# Patient Record
Sex: Female | Born: 1960
Health system: Southern US, Community
[De-identification: ages and names within clinical notes are randomized; demographics above are authoritative.]

## PROBLEM LIST (undated history)

## (undated) DIAGNOSIS — F419 Anxiety disorder, unspecified: Secondary | ICD-10-CM

## (undated) DIAGNOSIS — K219 Gastro-esophageal reflux disease without esophagitis: Secondary | ICD-10-CM

## (undated) DIAGNOSIS — I10 Essential (primary) hypertension: Secondary | ICD-10-CM

## (undated) DIAGNOSIS — N840 Polyp of corpus uteri: Secondary | ICD-10-CM

## (undated) DIAGNOSIS — D649 Anemia, unspecified: Secondary | ICD-10-CM

## (undated) DIAGNOSIS — T7840XA Allergy, unspecified, initial encounter: Secondary | ICD-10-CM

## (undated) DIAGNOSIS — R32 Unspecified urinary incontinence: Secondary | ICD-10-CM

## (undated) DIAGNOSIS — M1990 Unspecified osteoarthritis, unspecified site: Secondary | ICD-10-CM

## (undated) DIAGNOSIS — Z8719 Personal history of other diseases of the digestive system: Secondary | ICD-10-CM

## (undated) DIAGNOSIS — G4700 Insomnia, unspecified: Secondary | ICD-10-CM

## (undated) DIAGNOSIS — G4733 Obstructive sleep apnea (adult) (pediatric): Secondary | ICD-10-CM

## (undated) DIAGNOSIS — G4730 Sleep apnea, unspecified: Secondary | ICD-10-CM

## (undated) HISTORY — DX: Obstructive sleep apnea (adult) (pediatric): G47.33

## (undated) HISTORY — DX: Anemia, unspecified: D64.9

## (undated) HISTORY — DX: Unspecified urinary incontinence: R32

## (undated) HISTORY — DX: Gastro-esophageal reflux disease without esophagitis: K21.9

## (undated) HISTORY — DX: Sleep apnea, unspecified: G47.30

## (undated) HISTORY — DX: Polyp of corpus uteri: N84.0

## (undated) HISTORY — PX: OTHER SURGICAL HISTORY: SHX169

## (undated) HISTORY — DX: Personal history of other diseases of the digestive system: Z87.19

## (undated) HISTORY — PX: TONSILLECTOMY: SUR1361

## (undated) HISTORY — DX: Anxiety disorder, unspecified: F41.9

## (undated) HISTORY — DX: Allergy, unspecified, initial encounter: T78.40XA

## (undated) HISTORY — PX: CHOLECYSTECTOMY: SHX55

## (undated) HISTORY — DX: Essential (primary) hypertension: I10

## (undated) HISTORY — DX: Insomnia, unspecified: G47.00

## (undated) HISTORY — DX: Unspecified osteoarthritis, unspecified site: M19.90

## (undated) HISTORY — PX: URETHRAL SLING: SHX2621

---

## 1998-01-03 ENCOUNTER — Other Ambulatory Visit: Admission: RE | Admit: 1998-01-03 | Discharge: 1998-01-03 | Payer: Self-pay | Admitting: *Deleted

## 1998-02-22 ENCOUNTER — Ambulatory Visit (HOSPITAL_COMMUNITY): Admission: RE | Admit: 1998-02-22 | Discharge: 1998-02-22 | Payer: Self-pay | Admitting: *Deleted

## 1998-10-20 ENCOUNTER — Other Ambulatory Visit: Admission: RE | Admit: 1998-10-20 | Discharge: 1998-10-20 | Payer: Self-pay | Admitting: Obstetrics and Gynecology

## 1999-09-11 ENCOUNTER — Other Ambulatory Visit: Admission: RE | Admit: 1999-09-11 | Discharge: 1999-09-11 | Payer: Self-pay | Admitting: *Deleted

## 2001-02-09 ENCOUNTER — Other Ambulatory Visit: Admission: RE | Admit: 2001-02-09 | Discharge: 2001-02-09 | Payer: Self-pay | Admitting: Obstetrics and Gynecology

## 2002-02-10 ENCOUNTER — Other Ambulatory Visit: Admission: RE | Admit: 2002-02-10 | Discharge: 2002-02-10 | Payer: Self-pay | Admitting: Obstetrics and Gynecology

## 2002-05-04 ENCOUNTER — Encounter: Admission: RE | Admit: 2002-05-04 | Discharge: 2002-05-04 | Payer: Self-pay | Admitting: Otolaryngology

## 2002-05-04 ENCOUNTER — Encounter: Payer: Self-pay | Admitting: Otolaryngology

## 2002-09-03 ENCOUNTER — Encounter: Payer: Self-pay | Admitting: Internal Medicine

## 2002-09-03 ENCOUNTER — Encounter: Admission: RE | Admit: 2002-09-03 | Discharge: 2002-09-03 | Payer: Self-pay | Admitting: Internal Medicine

## 2003-02-17 ENCOUNTER — Other Ambulatory Visit: Admission: RE | Admit: 2003-02-17 | Discharge: 2003-02-17 | Payer: Self-pay | Admitting: Obstetrics and Gynecology

## 2004-07-03 ENCOUNTER — Other Ambulatory Visit: Admission: RE | Admit: 2004-07-03 | Discharge: 2004-07-03 | Payer: Self-pay | Admitting: Obstetrics and Gynecology

## 2006-04-21 ENCOUNTER — Ambulatory Visit (HOSPITAL_COMMUNITY): Admission: RE | Admit: 2006-04-21 | Discharge: 2006-04-21 | Payer: Self-pay | Admitting: Family Medicine

## 2006-06-04 ENCOUNTER — Ambulatory Visit (HOSPITAL_COMMUNITY): Admission: RE | Admit: 2006-06-04 | Discharge: 2006-06-04 | Payer: Self-pay | Admitting: General Surgery

## 2006-06-04 ENCOUNTER — Encounter (INDEPENDENT_AMBULATORY_CARE_PROVIDER_SITE_OTHER): Payer: Self-pay | Admitting: *Deleted

## 2006-12-11 ENCOUNTER — Ambulatory Visit (HOSPITAL_COMMUNITY): Admission: RE | Admit: 2006-12-11 | Discharge: 2006-12-11 | Payer: Self-pay | Admitting: Obstetrics and Gynecology

## 2009-05-17 ENCOUNTER — Encounter: Admission: RE | Admit: 2009-05-17 | Discharge: 2009-05-17 | Payer: Self-pay | Admitting: Obstetrics and Gynecology

## 2010-03-18 ENCOUNTER — Encounter: Payer: Self-pay | Admitting: Obstetrics and Gynecology

## 2010-07-10 NOTE — H&P (Signed)
Dawn [name], ANDREATTA                ACCOUNT NO.:  [id number]   MEDICAL RECORD NO.:  [id number]          PATIENT TYPE:  AMB   LOCATION:  SDC                           FACILITY:  [facility]   PHYSICIAN:  Juluis Mire, M.D.   DATE OF BIRTH:  [birth year]   DATE OF ADMISSION:  12/11/2006  DATE OF DISCHARGE:                              HISTORY & PHYSICAL   HISTORY OF PRESENT ILLNESS:  The patient is a 50 year old gravida 2,  para 1 abortus one female presents for mid urethral sling.   In relation to present admission, the patient's having trouble with  worsening stress incontinence.  She leaks with coughing and sneezing and  various physical activity is become increasing limited for her.  She did  undergo urodynamic testing here in the office.  Testing revealed no  evidence of uninhibited bladder contractions.  She had normal volumes  studies.  Did leak with coughing and Valsalva.  She did have normal leak  point pressures.  Urethral pressure profile was normal.  She had a  normal voiding cystourethrogram with no evidence of obstructive voiding  pattern.  This was consistent with urethral hypermobility, now presents  for a mid urethral sling.   ALLERGIES:  NO KNOWN DRUG ALLERGIES.   MEDICATIONS:  Effexor, Nexium, lisinopril and hydrochlorothiazide.   PAST MEDICAL HISTORY:  Being actively managed for hypertension as noted.  Otherwise usual childhood disease without any significant sequelae.   PAST SURGICAL HISTORY:  She has had tonsillectomy.  She has had her  gallbladder removed earlier this year.   OBSTETRICAL HISTORY:  She has had a vaginal delivery and a miscarriage.   SOCIAL HISTORY:  Reveals occasional alcohol.  No tobacco use.   FAMILY HISTORY:  Father with history of diabetes.  There is also a  history of heart disease and hypertension in the family.   REVIEW OF SYSTEMS:  Noncontributory.   PHYSICAL EXAMINATION:  VITAL SIGNS:  Afebrile, stable vital signs.  HEENT:  The patient  is normocephalic.  Pupils equal and react to light  accommodation.  Extraocular movements were intact.  Sclerae and  conjunctivae are clear.  Oropharynx clear.  NECK:  Without thyromegaly.  BREASTS:  No discrete masses.  LUNGS:  Clear.  CARDIOVASCULAR:  Regular rhythm and rate.  No murmurs or gallops.  ABDOMEN:  Exam is benign.  No mass, organomegaly or tenderness.  PELVIC:  Normal external genitalia.  Vaginal mucosa is clear.  Mild  cystourethrocele.  Cervix unremarkable.  Uterus normal size, shape and  contour.  Adnexa free of masses or tenderness.  EXTREMITIES:  Trace edema.  NEUROLOGICAL:  Grossly within normal limits.   IMPRESSION:  1. Anatomical stress urinary incontinence due to urethral      hypermobility.  2. Hypertension control.   PLAN:  The patient will undergo mid urethral sling using obturator  approach.  The risks have been explained including the risk of  infection.  The risk of hemorrhage could require transfusion with the  risk of AIDS or hepatitis.  Risk of injury to adjacent organs including  bladder, urethra or ureters that  could require further exploratory  surgery.  Risk of obturator nerve injury that could lead to with chronic  leg pain and weakness.  The risk of over tightening that could lead to  inability to void requiring taking down the sling that could have  recurrence of incontinence.  The risk of mesh erosion.  The risk of  increasing bladder spasms that could require long term medication.  The  risk of deep venous thrombosis and pulmonary embolus.  The patient  expressed understanding of indications and risks.      Juluis Mire, M.D.  Electronically Signed     JSM/MEDQ  D:  12/11/2006  T:  12/12/2006  Job:  [id number]

## 2010-07-10 NOTE — Op Note (Signed)
NAMESALOME, [name]                ACCOUNT NO.:  [id number]   MEDICAL RECORD NO.:  [id number]          PATIENT TYPE:  AMB   LOCATION:  SDC                           FACILITY:  [facility]   PHYSICIAN:  Juluis Mire, M.D.   DATE OF BIRTH:  03-17-1960   DATE OF PROCEDURE:  DATE OF DISCHARGE:                               OPERATIVE REPORT   PREOPERATIVE DIAGNOSIS:  Anatomical stress urinary incontinence due to  urethral hypermobility.   POSTOPERATIVE DIAGNOSIS:  Anatomical stress urinary incontinence due to  urethral hypermobility.   PROCEDURE:  1. Mid urethral sling using a transobturator approach.  2. Cystoscopy.   SURGEON:  Juluis Mire, M.D.   ANESTHESIA:  General.   ESTIMATED BLOOD LOSS:  Minimal.   PACKS AND DRAINS:  None.   INTRAOPERATIVE BLOOD REPLACED:  None.   COMPLICATIONS:  None.   INDICATIONS:  Noted in the history and physical.   DESCRIPTION OF PROCEDURE:  The patient was taken to the OR and placed in  the supine position.  After a satisfactory level general endotracheal  anesthesia was obtained, the patient was placed in dorsal position using  the Allen stirrups.  Perineum and vagina prepped out with Betadine and  draped in a sterile field.  A weighted speculum was placed in the  vaginal vault.  A prominent cystourethrocele was noted.  A Foley was put  in place and inflated.  The bladder was empty.  The mid urethral area  was identified, the vaginal mucosa was injected with Xylocaine and  1:100,000 of epinephrine.  A midline incision was made over the  suburethral area, and the vaginal mucosa was dissected free out  laterally.  We were able to bluntly dissect out to the obturator foramen  on each side.   Next, an area on the skin was identified at the level of the clitoris,  but below the abductor longus muscle tendon, and to the lateral aspect  of the inferior pubic ramus.  This area was injected with Xylocaine, and  incision was made.  Using the needles  they were passed first through the  skin around the obturator foramen, and out through the vaginal opening  on each side.  Cystoscopy was performed.  There was no bladder or  urethral injuries.  Ureteral orifices were identified, and urine was  spilling from both copiously.  Cystoscope was removed and Foley packs  were placed back to straight drain.  The polypropylene mesh was brought  in place and hooked to the needles, and brought out through the skin.  It was appropriately tightened in the mid urethral until it lay flat,  but would easily insert a Kelly, and rotate at 90-degrees.  So we had a  nice suburethral location, lying flat, but not overly tightened.  The  plastic sleeves removed.  The mesh was trimmed at the level of skin.  The skin was closed with Dermabond.  Vaginal incision closed with a  running suture of 2-0 Vicryl.  We had good hemostasis.  Foley was placed  back to straight drain.   The patient was taken  out of the dorsal lithotomy position.  Once alert  and extubated, transferred to the recovery room in good condition.  Sponge, instrument, and needle count reported correct by circulating  nurse x2.      Juluis Mire, M.D.  Electronically Signed     JSM/MEDQ  D:  12/11/2006  T:  12/12/2006  Job:  [id number]

## 2010-07-13 NOTE — Op Note (Signed)
Dawn [name], Dawn [name]                ACCOUNT NO.:  [id number]   MEDICAL RECORD NO.:  [id number]          PATIENT TYPE:  AMB   LOCATION:  DAY                          FACILITY:  [facility]   PHYSICIAN:  Anselm Pancoast. Weatherly, M.D.DATE OF BIRTH:  1960-03-03   DATE OF PROCEDURE:  06/04/2006  DATE OF DISCHARGE:                               OPERATIVE REPORT   PREOPERATIVE DIAGNOSIS:  Chronic cholecystitis with stones.   POSTOPERATIVE DIAGNOSIS:  Chronic cholecystitis with stones.   OPERATION:  Laparoscopic cholecystectomy with cholangiogram.   SURGEON:  Anselm Pancoast. Zachery Dakins, M.D.   ASSISTANT:  Francina Ames, M.D.   ANESTHESIA:  General anesthesia.   HISTORY:  Dawn [name] is a 50 year old female who was referred to me  courtesy of Dr. Merri Brunette for symptomatic gallstones.  The patient  has history of epigastric discomfort.  She has had 1 child who is now  approximately 59.  She has been on Nexium and had laboratory studies  that showed her H. pylori was positive.  She was treated for that but  still continued to have symptoms.  She then had an ultrasound that  showed about a 2 cm stone within her gallbladder.  She did not have an  acutely inflamed gallbladder.  She was referred to me and I saw her in  the office approximately 3 to 4 weeks ago.  At that time, she was  certainly not acutely ill.  Her symptoms certainly sounded like biliary  colic.  She is here today for cholecystectomy and cholangiogram.  She is  on hydrochlorothiazide, a small dose, and takes Celebrex occasionally  but not recently.  Preoperatively, she was given 3 grams of Unasyn.  She  has PAS stockings.  The patient was identified and taken to the  operative suite.  Induction of general anesthesia with endotracheal tube  or oral tube into the stomach.  Then, the abdomen was prepped with  Betadine solution and draped in sterile manner.  A small incision was  made below the umbilicus.  The fascia was identified and  picked up  between 2 Kochers and then carefully entered through this, identifying  the posterior layer which was carefully picked up and then a small  opening made.  The underlying peritoneum was opened with a Tresa Endo.  Pursestring suture of 0 Vicryl was placed.  Hasson cannula was  introduced.  The gallbladder was not acutely inflamed but there were  adhesions around it.  The upper 10-mm trocar was placed in the  subxiphoid area and the 2 lateral 5-mm trocars were placed by Dr. Maple Hudson,  the assistant.  The gallbladder was retracted upward and outward.  The  adhesions from the omentum and also adhesions to the duodenum area were  carefully taken down so that the proximal portion of the gallbladder  could be identified.  The cystic duct lymph node was identified and then  a small structure that we first thought was a small cystic duct was  clipped distally and then a small opening made __________  artery, and  it was double clipped proximally and then divided.  The  underlying  cystic duct was under this and it was picked up and elevated, clipped  flush with the gallbladder, and a small opening made proximally and a  Cook catheter introduced.  X-ray  was obtained which showed a fairly  long cystic duct with good flow into the duodenum and no evidence of any  abnormality of the extrahepatic biliary system.  The catheter was  removed.  The cystic duct was clipped proximally and then divided.  The  little structures posteriorly were carefully taken down in case there  was a posterior branch of the cystic artery.  It was clipped before  dividing.  The gallbladder was then freed from its bed with the [facility]  electrocautery.  No bile was spilled.  The gallbladder was placed in the  EndoCatch bag.  Good hemostasis of the gallbladder bed.  We then  switched the camera to the upper 10-mm port and withdrew the bag  containing the gallstone.  The stone would come up through the fascia  without actually  enlarging it, and then I closed the fascia with figure-  of-eight of 0 Vicryl in addition to the pursestrings and both of these  were tied.  We then put Marcaine in the fascia at the umbilicus and  irrigated, removing the little irrigating fluid and a small amount of  blood that had been lost with the artery before it had been clipped.  Everything was dry.  Then, the 5-mm ports were withdrawn under direct  vision.  The carbon dioxide was released.  The upper 10-mm trocar was  withdrawn with the head elevated so that if there would be any remaining  carbon dioxide it could be released.  We did put a figure-of-eight of 0  Vicryl in the fascia at the subxiphoid area.  Then, the subcutaneous  wounds were closed with 4-0 Vicryl.  Benzoin and Steri-Strips on the  skin.  The patient tolerated the procedure nicely.  We will let the  patient decide whether she wants to be discharged this afternoon.  She  should do fine.  I will see her back in the office in approximately 2  weeks.  She will resume all of her chronic medications and have Vicodin  for pain.           ______________________________  Anselm Pancoast. Zachery Dakins, M.D.     WJW/MEDQ  D:  06/04/2006  T:  06/04/2006  Job:  [id number]   cc:   Dario Guardian, M.D.  Fax: [phone]

## 2010-11-02 LAB — HM COLONOSCOPY

## 2010-12-05 LAB — CBC
HCT: 38.6
Hemoglobin: 13.1
MCHC: 34
MCV: 86.8
RBC: 4.45

## 2010-12-05 LAB — BASIC METABOLIC PANEL
BUN: 14
CO2: 28
Calcium: 9.5
Creatinine, Ser: 0.91
Glucose, Bld: 102 — ABNORMAL HIGH

## 2010-12-05 LAB — HCG, SERUM, QUALITATIVE: Preg, Serum: NEGATIVE

## 2011-06-17 ENCOUNTER — Other Ambulatory Visit: Payer: Self-pay | Admitting: Obstetrics and Gynecology

## 2012-05-20 ENCOUNTER — Other Ambulatory Visit: Payer: Self-pay | Admitting: Dermatology

## 2012-07-01 LAB — HM DEXA SCAN: HM Dexa Scan: NORMAL

## 2014-09-07 ENCOUNTER — Other Ambulatory Visit: Payer: Self-pay | Admitting: Obstetrics and Gynecology

## 2014-09-08 LAB — CYTOLOGY - PAP

## 2015-07-05 DIAGNOSIS — D239 Other benign neoplasm of skin, unspecified: Secondary | ICD-10-CM | POA: Diagnosis not present

## 2015-07-05 DIAGNOSIS — D2261 Melanocytic nevi of right upper limb, including shoulder: Secondary | ICD-10-CM | POA: Diagnosis not present

## 2015-07-05 DIAGNOSIS — D2262 Melanocytic nevi of left upper limb, including shoulder: Secondary | ICD-10-CM | POA: Diagnosis not present

## 2015-07-05 DIAGNOSIS — N760 Acute vaginitis: Secondary | ICD-10-CM | POA: Diagnosis not present

## 2015-07-05 DIAGNOSIS — D225 Melanocytic nevi of trunk: Secondary | ICD-10-CM | POA: Diagnosis not present

## 2015-09-11 DIAGNOSIS — Z01419 Encounter for gynecological examination (general) (routine) without abnormal findings: Secondary | ICD-10-CM | POA: Diagnosis not present

## 2015-09-11 DIAGNOSIS — Z6837 Body mass index (BMI) 37.0-37.9, adult: Secondary | ICD-10-CM | POA: Diagnosis not present

## 2015-09-11 DIAGNOSIS — N951 Menopausal and female climacteric states: Secondary | ICD-10-CM | POA: Diagnosis not present

## 2015-09-11 DIAGNOSIS — Z1231 Encounter for screening mammogram for malignant neoplasm of breast: Secondary | ICD-10-CM | POA: Diagnosis not present

## 2015-09-11 LAB — HM PAP SMEAR: HM PAP: NEGATIVE

## 2015-09-13 LAB — HM MAMMOGRAPHY

## 2015-10-25 DIAGNOSIS — N83202 Unspecified ovarian cyst, left side: Secondary | ICD-10-CM | POA: Diagnosis not present

## 2015-10-25 DIAGNOSIS — N950 Postmenopausal bleeding: Secondary | ICD-10-CM | POA: Diagnosis not present

## 2015-11-27 DIAGNOSIS — N840 Polyp of corpus uteri: Secondary | ICD-10-CM | POA: Diagnosis not present

## 2015-11-30 DIAGNOSIS — K644 Residual hemorrhoidal skin tags: Secondary | ICD-10-CM | POA: Diagnosis not present

## 2015-11-30 DIAGNOSIS — N840 Polyp of corpus uteri: Secondary | ICD-10-CM | POA: Diagnosis not present

## 2015-11-30 DIAGNOSIS — L918 Other hypertrophic disorders of the skin: Secondary | ICD-10-CM | POA: Diagnosis not present

## 2015-12-19 DIAGNOSIS — K219 Gastro-esophageal reflux disease without esophagitis: Secondary | ICD-10-CM | POA: Diagnosis not present

## 2015-12-19 DIAGNOSIS — I10 Essential (primary) hypertension: Secondary | ICD-10-CM | POA: Diagnosis not present

## 2015-12-19 DIAGNOSIS — G4700 Insomnia, unspecified: Secondary | ICD-10-CM | POA: Diagnosis not present

## 2015-12-19 DIAGNOSIS — Z23 Encounter for immunization: Secondary | ICD-10-CM | POA: Diagnosis not present

## 2015-12-19 DIAGNOSIS — F329 Major depressive disorder, single episode, unspecified: Secondary | ICD-10-CM | POA: Diagnosis not present

## 2016-03-19 DIAGNOSIS — L821 Other seborrheic keratosis: Secondary | ICD-10-CM | POA: Diagnosis not present

## 2016-03-19 DIAGNOSIS — L570 Actinic keratosis: Secondary | ICD-10-CM | POA: Diagnosis not present

## 2016-06-18 DIAGNOSIS — G4700 Insomnia, unspecified: Secondary | ICD-10-CM | POA: Diagnosis not present

## 2016-06-18 DIAGNOSIS — I10 Essential (primary) hypertension: Secondary | ICD-10-CM | POA: Diagnosis not present

## 2016-06-18 DIAGNOSIS — K219 Gastro-esophageal reflux disease without esophagitis: Secondary | ICD-10-CM | POA: Diagnosis not present

## 2016-06-18 DIAGNOSIS — F329 Major depressive disorder, single episode, unspecified: Secondary | ICD-10-CM | POA: Diagnosis not present

## 2016-07-09 ENCOUNTER — Ambulatory Visit (INDEPENDENT_AMBULATORY_CARE_PROVIDER_SITE_OTHER): Payer: BLUE CROSS/BLUE SHIELD | Admitting: Family Medicine

## 2016-07-09 ENCOUNTER — Encounter: Payer: Self-pay | Admitting: Family Medicine

## 2016-07-09 VITALS — BP 122/80 | HR 88 | Temp 98.6°F | Ht 67.0 in | Wt 238.5 lb

## 2016-07-09 DIAGNOSIS — K219 Gastro-esophageal reflux disease without esophagitis: Secondary | ICD-10-CM

## 2016-07-09 DIAGNOSIS — F419 Anxiety disorder, unspecified: Secondary | ICD-10-CM | POA: Diagnosis not present

## 2016-07-09 DIAGNOSIS — Z7189 Other specified counseling: Secondary | ICD-10-CM

## 2016-07-09 DIAGNOSIS — G4700 Insomnia, unspecified: Secondary | ICD-10-CM

## 2016-07-09 DIAGNOSIS — I10 Essential (primary) hypertension: Secondary | ICD-10-CM | POA: Diagnosis not present

## 2016-07-09 NOTE — Progress Notes (Signed)
Hypertension:    Using medication without problems or lightheadedness: yes Chest pain with exertion:no Edema:no Short of breath:no Diet and exercise d/w pt, encouraged.    Anxiety.  Longstanding.  Noted stressors caring for her elderly mother. Compliant with current medications. Rare use of benzodiazepine. No adverse effect on Effexor. No SI/HI.   Insomnia.  Long-standing. Likely exacerbated by anxiety. Had used Ambien before without adverse effect. Rare use of benzodiazepine as described above. Ambien did help her sleep.  Husband designated if patient were incapacitated.    Meds, vitals, and allergies reviewed.   PMH and SH reviewed  ROS: Per HPI unless specifically indicated in ROS section   GEN: nad, alert and oriented HEENT: mucous membranes moist NECK: supple w/o LA CV: rrr. PULM: ctab, no inc wob ABD: soft, +bs EXT: no edema SKIN: no acute rash

## 2016-07-09 NOTE — Patient Instructions (Addendum)
Check your meds and make sure you have a total of 20mg  of lisinopril and 25mg  of HCTZ in a day.  We may be able to combine that later on.   Please send me copy of your recent labs.   I'll work on getting your old records in the meantime.  Take care.  Glad to see you.

## 2016-07-10 ENCOUNTER — Encounter: Payer: Self-pay | Admitting: Family Medicine

## 2016-07-10 DIAGNOSIS — Z7189 Other specified counseling: Secondary | ICD-10-CM | POA: Insufficient documentation

## 2016-07-10 DIAGNOSIS — K219 Gastro-esophageal reflux disease without esophagitis: Secondary | ICD-10-CM | POA: Insufficient documentation

## 2016-07-10 DIAGNOSIS — I10 Essential (primary) hypertension: Secondary | ICD-10-CM | POA: Insufficient documentation

## 2016-07-10 DIAGNOSIS — G4700 Insomnia, unspecified: Secondary | ICD-10-CM | POA: Insufficient documentation

## 2016-07-10 DIAGNOSIS — R32 Unspecified urinary incontinence: Secondary | ICD-10-CM | POA: Insufficient documentation

## 2016-07-10 DIAGNOSIS — F419 Anxiety disorder, unspecified: Secondary | ICD-10-CM | POA: Insufficient documentation

## 2016-07-10 NOTE — Assessment & Plan Note (Signed)
Husband designated if patient were incapacitated. 

## 2016-07-10 NOTE — Assessment & Plan Note (Signed)
Continue present medication. No adverse effect on medication. Requesting records. She agrees.

## 2016-07-10 NOTE — Assessment & Plan Note (Signed)
Continue present medication. No adverse effect on medication. Requesting records. She agrees. 

## 2016-07-10 NOTE — Assessment & Plan Note (Addendum)
Continue present medication. No adverse effect on medication. Requesting records. She agrees. >30 minutes spent in face to face time with patient, >50% spent in counselling or coordination of care, discussing diet and exercise, rationale for current meds, etc.

## 2016-07-17 ENCOUNTER — Encounter: Payer: Self-pay | Admitting: Family Medicine

## 2016-07-30 ENCOUNTER — Encounter: Payer: Self-pay | Admitting: Family Medicine

## 2016-07-30 LAB — GLUCOSE, RANDOM
ALT: 14
AST: 16 U/L
BUN: 14 mg/dL (ref 4–21)
CREATININE: 0.98
GLUCOSE: 97

## 2016-08-20 ENCOUNTER — Encounter: Payer: Self-pay | Admitting: Family Medicine

## 2016-09-03 DIAGNOSIS — D225 Melanocytic nevi of trunk: Secondary | ICD-10-CM | POA: Diagnosis not present

## 2016-09-03 DIAGNOSIS — D2261 Melanocytic nevi of right upper limb, including shoulder: Secondary | ICD-10-CM | POA: Diagnosis not present

## 2016-09-03 DIAGNOSIS — Z808 Family history of malignant neoplasm of other organs or systems: Secondary | ICD-10-CM | POA: Diagnosis not present

## 2016-09-03 DIAGNOSIS — D2271 Melanocytic nevi of right lower limb, including hip: Secondary | ICD-10-CM | POA: Diagnosis not present

## 2016-09-03 DIAGNOSIS — L570 Actinic keratosis: Secondary | ICD-10-CM | POA: Diagnosis not present

## 2016-09-11 ENCOUNTER — Encounter: Payer: Self-pay | Admitting: Family Medicine

## 2016-09-11 DIAGNOSIS — Z6837 Body mass index (BMI) 37.0-37.9, adult: Secondary | ICD-10-CM | POA: Diagnosis not present

## 2016-09-11 DIAGNOSIS — Z1231 Encounter for screening mammogram for malignant neoplasm of breast: Secondary | ICD-10-CM | POA: Diagnosis not present

## 2016-09-11 DIAGNOSIS — Z01419 Encounter for gynecological examination (general) (routine) without abnormal findings: Secondary | ICD-10-CM | POA: Diagnosis not present

## 2016-09-12 ENCOUNTER — Other Ambulatory Visit: Payer: Self-pay | Admitting: *Deleted

## 2016-09-12 MED ORDER — ESOMEPRAZOLE MAGNESIUM 40 MG PO CPDR: 40.0000 mg | DELAYED_RELEASE_CAPSULE | Freq: Every day | ORAL | 0 refills | Status: DC

## 2016-09-25 ENCOUNTER — Encounter: Payer: Self-pay | Admitting: Family Medicine

## 2016-09-26 ENCOUNTER — Other Ambulatory Visit: Payer: Self-pay | Admitting: Family Medicine

## 2016-09-26 MED ORDER — LISINOPRIL-HYDROCHLOROTHIAZIDE 20-25 MG PO TABS: 1.0000 | ORAL_TABLET | Freq: Every day | ORAL | 3 refills | Status: DC

## 2016-09-26 MED ORDER — VENLAFAXINE HCL ER 150 MG PO CP24: 150.0000 mg | ORAL_CAPSULE | Freq: Every day | ORAL | 3 refills | Status: DC

## 2016-10-16 DIAGNOSIS — N950 Postmenopausal bleeding: Secondary | ICD-10-CM | POA: Diagnosis not present

## 2016-10-24 DIAGNOSIS — N950 Postmenopausal bleeding: Secondary | ICD-10-CM | POA: Diagnosis not present

## 2016-10-24 DIAGNOSIS — D251 Intramural leiomyoma of uterus: Secondary | ICD-10-CM | POA: Diagnosis not present

## 2016-12-04 DIAGNOSIS — Z23 Encounter for immunization: Secondary | ICD-10-CM | POA: Diagnosis not present

## 2016-12-04 DIAGNOSIS — L570 Actinic keratosis: Secondary | ICD-10-CM | POA: Diagnosis not present

## 2016-12-24 DIAGNOSIS — N950 Postmenopausal bleeding: Secondary | ICD-10-CM | POA: Diagnosis not present

## 2016-12-31 ENCOUNTER — Telehealth: Payer: Self-pay | Admitting: Gastroenterology

## 2016-12-31 NOTE — Telephone Encounter (Signed)
Dr. Russella DarStark reviewed records and okay to schedule colon.  LM on Vmail to call back to schedule

## 2016-12-31 NOTE — Telephone Encounter (Signed)
Patient states that she is due for another colonoscopy. Received Colon report and placed on Dr. Ardell IsaacsStark's desk for review. Dr. Russella DarStark is Doc of the Day for 09/13/16 AM. Records placed on his desk for review.

## 2017-02-12 NOTE — Telephone Encounter (Signed)
Patient has not returned call to schedule. Records will be in "records reviewed" folder. °

## 2017-03-07 ENCOUNTER — Encounter: Payer: Self-pay | Admitting: Family Medicine

## 2017-03-18 ENCOUNTER — Encounter: Payer: Self-pay | Admitting: Family Medicine

## 2017-03-18 LAB — LAB REPORT - SCANNED
ALT: 22
AST: 20
CHOLESTEROL, TOTAL: 205
Creatinine, Ser: 1.04
GLUCOSE: 106
HCT: 33
HDL: 67
Hemoglobin: 11.1
LDL Cholesterol (Calc): 123
MCV: 71
Potassium: 3.8
TRIGLYCERIDES: 74
TSH: 3.43

## 2017-03-24 ENCOUNTER — Other Ambulatory Visit: Payer: Self-pay | Admitting: Family Medicine

## 2017-03-24 ENCOUNTER — Ambulatory Visit: Payer: BLUE CROSS/BLUE SHIELD | Admitting: Family Medicine

## 2017-03-24 ENCOUNTER — Encounter: Payer: Self-pay | Admitting: Family Medicine

## 2017-03-24 VITALS — BP 134/80 | HR 71 | Temp 99.1°F | Wt 242.0 lb

## 2017-03-24 DIAGNOSIS — D649 Anemia, unspecified: Secondary | ICD-10-CM | POA: Diagnosis not present

## 2017-03-24 DIAGNOSIS — R739 Hyperglycemia, unspecified: Secondary | ICD-10-CM

## 2017-03-24 LAB — IBC PANEL
IRON: 32 ug/dL — AB (ref 42–145)
SATURATION RATIOS: 6.1 % — AB (ref 20.0–50.0)
Transferrin: 377 mg/dL — ABNORMAL HIGH (ref 212.0–360.0)

## 2017-03-24 LAB — CBC WITH DIFFERENTIAL/PLATELET
Basophils Absolute: 0 10*3/uL (ref 0.0–0.1)
Basophils Relative: 0.7 % (ref 0.0–3.0)
EOS ABS: 0.1 10*3/uL (ref 0.0–0.7)
Eosinophils Relative: 2.7 % (ref 0.0–5.0)
HCT: 33.6 % — ABNORMAL LOW (ref 36.0–46.0)
HEMOGLOBIN: 10.7 g/dL — AB (ref 12.0–15.0)
LYMPHS ABS: 1.5 10*3/uL (ref 0.7–4.0)
Lymphocytes Relative: 31.3 % (ref 12.0–46.0)
MCHC: 32 g/dL (ref 30.0–36.0)
MCV: 73.2 fl — ABNORMAL LOW (ref 78.0–100.0)
MONO ABS: 0.4 10*3/uL (ref 0.1–1.0)
Monocytes Relative: 8 % (ref 3.0–12.0)
NEUTROS PCT: 57.3 % (ref 43.0–77.0)
Neutro Abs: 2.7 10*3/uL (ref 1.4–7.7)
Platelets: 275 10*3/uL (ref 150.0–400.0)
RBC: 4.59 Mil/uL (ref 3.87–5.11)
RDW: 17.9 % — AB (ref 11.5–15.5)
WBC: 4.6 10*3/uL (ref 4.0–10.5)

## 2017-03-24 LAB — FERRITIN: Ferritin: 4.7 ng/mL — ABNORMAL LOW (ref 10.0–291.0)

## 2017-03-24 NOTE — Patient Instructions (Signed)
Go to the lab on the way out.  We'll contact you with your lab report. Take care.  Glad to see you.  Update me as needed.   

## 2017-03-24 NOTE — Progress Notes (Signed)
F/u re: labs.  Her hemoglobin was borderline low and MCV was low.  D/w pt.  No blood in stool. No black stools.  Normal colonoscopy prev in 2012.  FH polyps noted but not on patient's last colonoscopy.    H/o iron def in the past, per patient report.  No VB usually except for a period in august per gyn with neg w/u including neg biopsy per patient report.  She had another period in October.  No periods or VB since.    She is fasting.  She isn't on extra iron.  She isn't a vegetarian.    She is going to work on diet and exercise re: sugar.  D/w pt about prev sugar and labs.    Meds, vitals, and allergies reviewed.   ROS: Per HPI unless specifically indicated in ROS section   GEN: nad, alert and oriented HEENT: mucous membranes moist NECK: supple w/o LA CV: rrr PULM: ctab, no inc wob ABD: soft, +bs EXT: no edema

## 2017-03-25 ENCOUNTER — Other Ambulatory Visit: Payer: Self-pay | Admitting: *Deleted

## 2017-03-25 MED ORDER — ESOMEPRAZOLE MAGNESIUM 40 MG PO CPDR: 40.0000 mg | DELAYED_RELEASE_CAPSULE | Freq: Every day | ORAL | 3 refills | Status: DC

## 2017-03-25 NOTE — Telephone Encounter (Signed)
MyChart request.  See MyChart Message Ambien 10 mg  Last office visit:   03/24/17 Last Filled:   ? Please advise.

## 2017-03-26 DIAGNOSIS — D649 Anemia, unspecified: Secondary | ICD-10-CM | POA: Insufficient documentation

## 2017-03-26 DIAGNOSIS — R739 Hyperglycemia, unspecified: Secondary | ICD-10-CM | POA: Insufficient documentation

## 2017-03-26 MED ORDER — ZOLPIDEM TARTRATE 10 MG PO TABS: ORAL_TABLET | ORAL | 2 refills | Status: DC

## 2017-03-26 MED ORDER — IRON 325 (65 FE) MG PO TABS: 325.0000 mg | ORAL_TABLET | Freq: Every day | ORAL | Status: DC

## 2017-03-26 NOTE — Assessment & Plan Note (Signed)
See notes on labs.  The concern was for iron deficiency and had previous colonoscopy.  Family history of polyps noted.  Discussed with patient about options.  Reasonable to recheck labs today, likely refer back to GI.  Okay for outpatient follow-up. She agrees.

## 2017-03-26 NOTE — Telephone Encounter (Signed)
Sent rx.  mychart message sent to patient.  Thanks.

## 2017-03-26 NOTE — Assessment & Plan Note (Signed)
Discussed with patient about diet and exercise and previous labs.

## 2017-03-27 ENCOUNTER — Telehealth: Payer: Self-pay | Admitting: *Deleted

## 2017-03-27 ENCOUNTER — Encounter: Payer: Self-pay | Admitting: Gastroenterology

## 2017-03-27 NOTE — Telephone Encounter (Addendum)
She needs it due to the iron deficiency anemia.  The goal is to identify the source of the anemia/possible GI loss.  I sent her a message via mychart.  Thanks.

## 2017-03-27 NOTE — Telephone Encounter (Signed)
Upon phoning the patient about lab results, patient states she was called today to set up a colonoscopy which is scheduled for April.  Patient says she did not have any problems noted on her last colonoscopy about 5 years ago and was not really sure that she needs it.  Can you please send her a MyChart message as to whether this is needed?

## 2017-04-17 ENCOUNTER — Other Ambulatory Visit (INDEPENDENT_AMBULATORY_CARE_PROVIDER_SITE_OTHER): Payer: BLUE CROSS/BLUE SHIELD

## 2017-04-17 DIAGNOSIS — D649 Anemia, unspecified: Secondary | ICD-10-CM

## 2017-04-17 LAB — COMPREHENSIVE METABOLIC PANEL
ALBUMIN: 4.4 g/dL (ref 3.5–5.2)
ALK PHOS: 83 U/L (ref 39–117)
ALT: 13 U/L (ref 0–35)
AST: 15 U/L (ref 0–37)
BUN: 19 mg/dL (ref 6–23)
CO2: 32 mEq/L (ref 19–32)
CREATININE: 0.94 mg/dL (ref 0.40–1.20)
Calcium: 9.9 mg/dL (ref 8.4–10.5)
Chloride: 103 mEq/L (ref 96–112)
GFR: 65.31 mL/min (ref >60.00)
Glucose, Bld: 99 mg/dL (ref 70–99)
Potassium: 3.6 mEq/L (ref 3.5–5.1)
SODIUM: 142 meq/L (ref 135–145)
TOTAL PROTEIN: 7.1 g/dL (ref 6.0–8.3)
Total Bilirubin: 0.3 mg/dL (ref 0.2–1.2)

## 2017-04-17 LAB — IBC PANEL
Iron: 36 ug/dL — ABNORMAL LOW (ref 42–145)
Saturation Ratios: 7.3 % — ABNORMAL LOW (ref 20.0–50.0)
Transferrin: 354 mg/dL (ref 212.0–360.0)

## 2017-04-21 ENCOUNTER — Encounter: Payer: Self-pay | Admitting: Family Medicine

## 2017-04-22 ENCOUNTER — Other Ambulatory Visit: Payer: Self-pay | Admitting: Family Medicine

## 2017-04-22 DIAGNOSIS — D649 Anemia, unspecified: Secondary | ICD-10-CM

## 2017-04-22 MED ORDER — IRON 325 (65 FE) MG PO TABS: 325.0000 mg | ORAL_TABLET | Freq: Two times a day (BID) | ORAL | Status: DC

## 2017-05-20 ENCOUNTER — Other Ambulatory Visit: Payer: Self-pay

## 2017-05-20 ENCOUNTER — Ambulatory Visit (AMBULATORY_SURGERY_CENTER): Payer: Self-pay | Admitting: *Deleted

## 2017-05-20 ENCOUNTER — Other Ambulatory Visit (INDEPENDENT_AMBULATORY_CARE_PROVIDER_SITE_OTHER): Payer: BLUE CROSS/BLUE SHIELD

## 2017-05-20 VITALS — Ht 67.5 in | Wt 238.0 lb

## 2017-05-20 DIAGNOSIS — Z8371 Family history of colonic polyps: Secondary | ICD-10-CM

## 2017-05-20 DIAGNOSIS — D509 Iron deficiency anemia, unspecified: Secondary | ICD-10-CM

## 2017-05-20 DIAGNOSIS — D649 Anemia, unspecified: Secondary | ICD-10-CM | POA: Diagnosis not present

## 2017-05-20 LAB — CBC WITH DIFFERENTIAL/PLATELET
Basophils Absolute: 0.1 10*3/uL (ref 0.0–0.1)
Basophils Relative: 0.9 % (ref 0.0–3.0)
Eosinophils Absolute: 0.1 10*3/uL (ref 0.0–0.7)
Eosinophils Relative: 1.8 % (ref 0.0–5.0)
HCT: 41.5 % (ref 36.0–46.0)
HEMOGLOBIN: 13.7 g/dL (ref 12.0–15.0)
LYMPHS ABS: 2.3 10*3/uL (ref 0.7–4.0)
Lymphocytes Relative: 34.2 % (ref 12.0–46.0)
MCHC: 32.9 g/dL (ref 30.0–36.0)
MCV: 82 fl (ref 78.0–100.0)
Monocytes Absolute: 0.5 10*3/uL (ref 0.1–1.0)
Monocytes Relative: 7.6 % (ref 3.0–12.0)
NEUTROS PCT: 55.5 % (ref 43.0–77.0)
Neutro Abs: 3.7 10*3/uL (ref 1.4–7.7)
Platelets: 252 10*3/uL (ref 150.0–400.0)
RBC: 5.05 Mil/uL (ref 3.87–5.11)
RDW: 24.1 % — ABNORMAL HIGH (ref 11.5–15.5)
WBC: 6.7 10*3/uL (ref 4.0–10.5)

## 2017-05-20 LAB — IBC PANEL
Iron: 40 ug/dL — ABNORMAL LOW (ref 42–145)
SATURATION RATIOS: 8.8 % — AB (ref 20.0–50.0)
Transferrin: 323 mg/dL (ref 212.0–360.0)

## 2017-05-20 MED ORDER — NA SULFATE-K SULFATE-MG SULF 17.5-3.13-1.6 GM/177ML PO SOLN: 1.0000 | Freq: Once | ORAL | 0 refills | Status: AC

## 2017-05-20 NOTE — Progress Notes (Signed)
No egg or soy allergy known to patient  No issues with past sedation with any surgeries  or procedures, no intubation problems  No diet pills per patient No home 02 use per patient  No blood thinners per patient  Pt denies issues with constipation  No A fib or A flutter  EMMI video sent to pt's e mail  Pt given a pay no more than $50 coupon for suprep in [place]

## 2017-05-25 ENCOUNTER — Encounter: Payer: Self-pay | Admitting: Family Medicine

## 2017-05-27 ENCOUNTER — Ambulatory Visit: Payer: BLUE CROSS/BLUE SHIELD | Admitting: Podiatry

## 2017-05-27 ENCOUNTER — Encounter: Payer: Self-pay | Admitting: Gastroenterology

## 2017-05-27 ENCOUNTER — Encounter: Payer: Self-pay | Admitting: Podiatry

## 2017-05-27 VITALS — BP 130/74 | HR 92 | Resp 18

## 2017-05-27 DIAGNOSIS — L603 Nail dystrophy: Secondary | ICD-10-CM

## 2017-05-27 DIAGNOSIS — L853 Xerosis cutis: Secondary | ICD-10-CM

## 2017-05-27 NOTE — Progress Notes (Signed)
Subjective:    Patient ID: Dawn [name], female    DOB: Sep 02, 1960, 57 y.o.   MRN: [id number]  HPI 57 year old female presents the office today because she wants to have her toenails checked.  She states that several years ago she was seen by us and she was given formula 3 (she thinks) for toenail fungus. She states that it did not help.  Her nails have been the same for several years and not been getting worse they have not gotten any better.  She states that she has noticed some mild discoloration of nails as well as ridging to the toenails but it remain of normal thickness.  Occasionally the nails will split towards the tip of the nails.  She denies any pain to the nails and she denies any redness or drainage or any swelling.  She has no other concerns.   Review of Systems  All other systems reviewed and are negative.  Past Medical History:  Diagnosis Date  . Allergy   . Anemia   . Anxiety   . Arthritis   . GERD (gastroesophageal reflux disease)   . HTN (hypertension)   . Insomnia   . Urinary incontinence   . Uterine polyp    s/p resection    Past Surgical History:  Procedure Laterality Date  . CHOLECYSTECTOMY    . COLONOSCOPY    . TONSILLECTOMY    . URETHRAL SLING    . VAGINAL DELIVERY       Current Outpatient Medications:  .  clonazePAM (KLONOPIN) 0.5 MG tablet, Take 0.25 mg by mouth 2 (two) times daily as needed for anxiety., Disp: , Rfl:  .  esomeprazole (NEXIUM) 40 MG capsule, Take 1 capsule (40 mg total) by mouth daily at 12 noon., Disp: 90 capsule, Rfl: 3 .  Ferrous Sulfate (IRON) 325 (65 Fe) MG TABS, Take 1 tablet (325 mg total) by mouth 2 (two) times daily., Disp: 30 each, Rfl:  .  ibuprofen (ADVIL,MOTRIN) 200 MG tablet, Take 200 mg by mouth every 6 (six) hours as needed., Disp: , Rfl:  .  lisinopril-hydrochlorothiazide (PRINZIDE,ZESTORETIC) 20-25 MG tablet, Take 1 tablet by mouth daily., Disp: 90 tablet, Rfl: 3 .  venlafaxine XR (EFFEXOR-XR) 150 MG 24  hr capsule, Take 1 capsule (150 mg total) by mouth daily with breakfast., Disp: 90 capsule, Rfl: 3 .  zolpidem (AMBIEN) 10 MG tablet, Take one half (5 mg total) by mouth each evening., Disp: 30 tablet, Rfl: 2  No Known Allergies  Social History   Socioeconomic History  . Marital status: Married    Spouse name: Not on file  . Number of children: Not on file  . Years of education: Not on file  . Highest education level: Not on file  Occupational History  . Not on file  Social Needs  . Financial resource strain: Not on file  . Food insecurity:    Worry: Not on file    Inability: Not on file  . Transportation needs:    Medical: Not on file    Non-medical: Not on file  Tobacco Use  . Smoking status: Never Smoker  . Smokeless tobacco: Never Used  Substance and Sexual Activity  . Alcohol use: Yes    Comment: socially  . Drug use: No  . Sexual activity: Not on file  Lifestyle  . Physical activity:    Days per week: Not on file    Minutes per session: Not on file  . Stress: Not on file  Relationships  . Social connections:    Talks on phone: Not on file    Gets together: Not on file    Attends religious service: Not on file    Active member of club or organization: Not on file    Attends meetings of clubs or organizations: Not on file    Relationship status: Not on file  . Intimate partner violence:    Fear of current or ex partner: Not on file    Emotionally abused: Not on file    Physically abused: Not on file    Forced sexual activity: Not on file  Other Topics Concern  . Not on file  Social History Narrative   Married 1984   Public relations account executive        Objective:   Physical Exam  General: AAO x3, NAD  Dermatological: Nails are very somewhat mildly dystrophic with yellow discoloration and mild region within the nails.  They are unchanged.  There is no pain in the nails there is no surrounding redness or drainage.  No significant splitting of the toenails  today there is no induration present.  There is no clinical signs of infection.  Dry skin is present bilaterally.  Vascular: Dorsalis Pedis artery and Posterior Tibial artery pedal pulses are 2/4 bilateral with immedate capillary fill time. There is no pain with calf compression, swelling, warmth, erythema.   Neruologic: Grossly intact via light touch bilateral.  Protective threshold with Semmes Wienstein monofilament intact to all pedal sites bilateral.   Musculoskeletal: No gross boney pedal deformities bilateral. No pain, crepitus, or limitation noted with foot and ankle range of motion bilateral. Muscular strength 5/5 in all groups tested bilateral.  Gait: Unassisted, Nonantalgic.     Assessment & Plan:  57 year old female onychodystrophy bilaterally with dry skin -Treatment options discussed including all alternatives, risks, and complications -Etiology of symptoms were discussed -At this point she is tried antifungal medicine without any improvement.  Think at this point this is more due to dystrophic nail discussed more natural remedies including tea tree oil to her nails.  Also recommended not to use nail polish at all times.  Nails need to have time to breathe.  Discussed moisturizer for her feet daily. She agrees to this plan. If there is any changes to the nails including thickening, discoloration, pain or signs of infection to let us know.  -RTC prn  Vivi Barrack DPM

## 2017-06-03 ENCOUNTER — Other Ambulatory Visit: Payer: Self-pay

## 2017-06-03 ENCOUNTER — Ambulatory Visit (AMBULATORY_SURGERY_CENTER): Payer: BLUE CROSS/BLUE SHIELD | Admitting: Gastroenterology

## 2017-06-03 ENCOUNTER — Encounter: Payer: Self-pay | Admitting: Gastroenterology

## 2017-06-03 VITALS — BP 120/84 | HR 68 | Temp 97.5°F | Resp 12 | Ht 67.5 in | Wt 238.0 lb

## 2017-06-03 DIAGNOSIS — Z83719 Family history of colon polyps, unspecified: Secondary | ICD-10-CM

## 2017-06-03 DIAGNOSIS — Z1211 Encounter for screening for malignant neoplasm of colon: Secondary | ICD-10-CM | POA: Diagnosis not present

## 2017-06-03 DIAGNOSIS — Z8371 Family history of colonic polyps: Secondary | ICD-10-CM | POA: Diagnosis not present

## 2017-06-03 DIAGNOSIS — D509 Iron deficiency anemia, unspecified: Secondary | ICD-10-CM

## 2017-06-03 HISTORY — PX: COLONOSCOPY: SHX174

## 2017-06-03 MED ORDER — SODIUM CHLORIDE 0.9 % IV SOLN: 500.0000 mL | Freq: Once | INTRAVENOUS | Status: DC

## 2017-06-03 NOTE — Progress Notes (Signed)
No problems noted in the recovery room. maw 

## 2017-06-03 NOTE — Progress Notes (Signed)
Per Merrilyn Pumaory Wall, CRNA, he removed and charted IV removal while pt was in the procedure room.  He also charted the amount of fluid. Maw  No problems noted in the recoveru room. maw

## 2017-06-03 NOTE — Op Note (Signed)
[facility] Patient Name: Dawn [name] Procedure Date: 06/03/2017 11:09 AM MRN: [id number] Endoscopist: Meryl Dare , MD Age: 57 Referring MD:  Date of Birth: 26-Apr-1960 Gender: Female Account #: [id number] Procedure:                Colonoscopy Indications:              Colon cancer screening in patient at increased                            risk: Family history of 1st-degree relative with                            colon polyps Medicines:                Monitored Anesthesia Care Procedure:                Pre-Anesthesia Assessment:                           - Prior to the procedure, a History and Physical                            was performed, and patient medications and                            allergies were reviewed. The patient's tolerance of                            previous anesthesia was also reviewed. The risks                            and benefits of the procedure and the sedation                            options and risks were discussed with the patient.                            All questions were answered, and informed consent                            was obtained. Prior Anticoagulants: The patient has                            taken no previous anticoagulant or antiplatelet                            agents. ASA Grade Assessment: II - A patient with                            mild systemic disease. After reviewing the risks                            and benefits, the patient was deemed in  satisfactory condition to undergo the procedure.                           After obtaining informed consent, the colonoscope                            was passed under direct vision. Throughout the                            procedure, the patient's blood pressure, pulse, and                            oxygen saturations were monitored continuously. The                            Colonoscope was introduced through the anus and                           advanced to the the cecum, identified by                            appendiceal orifice and ileocecal valve. The                            ileocecal valve, appendiceal orifice, and rectum                            were photographed. The quality of the bowel                            preparation was good. The colonoscopy was performed                            without difficulty. The colon was tortuous. The                            patient tolerated the procedure well. Scope In: 11:17:59 AM Scope Out: 11:36:42 AM Scope Withdrawal Time: 0 hours 10 minutes 51 seconds  Total Procedure Duration: 0 hours 18 minutes 43 seconds  Findings:                 The perianal and digital rectal examinations were                            normal.                           The entire examined colon appeared normal on direct                            and retroflexion views. Complications:            No immediate complications. Estimated blood loss:                            None. Estimated Blood Loss:  Estimated blood loss: none. Impression:               - The entire examined colon is normal on direct and                            retroflexion views.                           - No specimens collected. Recommendation:           - Repeat colonoscopy in 5 years for screening                            purposes.                           - Patient has a contact number available for                            emergencies. The signs and symptoms of potential                            delayed complications were discussed with the                            patient. Return to normal activities tomorrow.                            Written discharge instructions were provided to the                            patient.                           - Resume previous diet.                           - Continue present medications. Meryl Dare, MD 06/03/2017 11:40:37 AM This  report has been signed electronically.

## 2017-06-03 NOTE — Patient Instructions (Signed)
YOU HAD AN ENDOSCOPIC PROCEDURE TODAY AT THE [facility]:   Refer to the procedure report that was given to you for any specific questions about what was found during the examination.  If the procedure report does not answer your questions, please call your gastroenterologist to clarify.  If you requested that your care partner not be given the details of your procedure findings, then the procedure report has been included in a sealed envelope for you to review at your convenience later.  YOU SHOULD EXPECT: Some feelings of bloating in the abdomen. Passage of more gas than usual.  Walking can help get rid of the air that was put into your GI tract during the procedure and reduce the bloating. If you had a lower endoscopy (such as a colonoscopy or flexible sigmoidoscopy) you may notice spotting of blood in your stool or on the toilet paper. If you underwent a bowel prep for your procedure, you may not have a normal bowel movement for a few days.  Please Note:  You might notice some irritation and congestion in your nose or some drainage.  This is from the oxygen used during your procedure.  There is no need for concern and it should clear up in a day or so.  SYMPTOMS TO REPORT IMMEDIATELY:   Following lower endoscopy (colonoscopy or flexible sigmoidoscopy):  Excessive amounts of blood in the stool  Significant tenderness or worsening of abdominal pains  Swelling of the abdomen that is new, acute  Fever of 100F or higher  For urgent or emergent issues, a gastroenterologist can be reached at any hour by calling [phone].   DIET:  We do recommend a small meal at first, but then you may proceed to your regular diet.  Drink plenty of fluids but you should avoid alcoholic beverages for 24 hours.  ACTIVITY:  You should plan to take it easy for the rest of today and you should NOT DRIVE or use heavy machinery until tomorrow (because of the sedation medicines used during the test).     FOLLOW UP: Our staff will call the number listed on your records the next business day following your procedure to check on you and address any questions or concerns that you may have regarding the information given to you following your procedure. If we do not reach you, we will leave a message.  However, if you are feeling well and you are not experiencing any problems, there is no need to return our call.  We will assume that you have returned to your regular daily activities without incident.  If any biopsies were taken you will be contacted by phone or by letter within the next 1-3 weeks.  Please call us at [phone] if you have not heard about the biopsies in 3 weeks.    SIGNATURES/CONFIDENTIALITY: You and/or your care partner have signed paperwork which will be entered into your electronic medical record.  These signatures attest to the fact that that the information above on your After Visit Summary has been reviewed and is understood.  Full responsibility of the confidentiality of this discharge information lies with you and/or your care-partner.    You may resume your current medications today. Repeat screening colonoscopy in 5 years. Please call if any questions or concerns.   

## 2017-06-03 NOTE — Progress Notes (Signed)
Report given to PACU, vss 

## 2017-06-04 ENCOUNTER — Telehealth: Payer: Self-pay

## 2017-06-04 ENCOUNTER — Telehealth: Payer: Self-pay | Admitting: Family Medicine

## 2017-06-04 DIAGNOSIS — D509 Iron deficiency anemia, unspecified: Secondary | ICD-10-CM

## 2017-06-04 DIAGNOSIS — D649 Anemia, unspecified: Secondary | ICD-10-CM

## 2017-06-04 NOTE — Telephone Encounter (Signed)
Call pt.  Would recheck labs in about 1 month.  Continue iron for now.  Normal colonoscopy.  Thanks.  Orders are in.

## 2017-06-04 NOTE — Telephone Encounter (Signed)
  Follow up Call-  Call back number 06/03/2017  Post procedure Call Back phone  # [phone]  Permission to leave phone message Yes  Some recent data might be hidden     Patient questions:  Do you have a fever, pain , or abdominal swelling? No. Pain Score  0 *  Have you tolerated food without any problems? Yes.    Have you been able to return to your normal activities? Yes.    Do you have any questions about your discharge instructions: Diet   No. Medications  No. Follow up visit  No.  Do you have questions or concerns about your Care? No.  Actions: * If pain score is 4 or above: No action needed, pain <4.  Pt reported no problems.  She appreciate the "great care" she received and commented she like this prep much better than drinking the large volume at one time. maw

## 2017-06-04 NOTE — Telephone Encounter (Signed)
Yes and we will contact her to recommend EGD, celiac antibodies. Thanks.

## 2017-06-04 NOTE — Telephone Encounter (Signed)
Thanks.  I'll defer.  

## 2017-06-04 NOTE — Telephone Encounter (Signed)
Patient notified as instructed by telephone and verbalized understanding. 

## 2017-06-04 NOTE — Telephone Encounter (Signed)
I'll recheck her iron/cbc in about 1 month.  With recent normal colonoscopy, does she need extra w/u for iron def anemia?  Thanks for your help.  GSD.

## 2017-06-04 NOTE — Telephone Encounter (Signed)
-----   Message from Meryl DareMalcolm T Stark, MD sent at 06/04/2017 12:04 PM EDT ----- Has Fe def anemia with negative colonoscopy.  Please contact her to recommend EGD, tTG, IgA, stool hemoccults.

## 2017-06-04 NOTE — Telephone Encounter (Signed)
Patient notified of the recommendations She will come for pre-visit on 06/19/17 I mailed her hemoccult cards she will complete them and return them on 06/19/17 to the lab for her tTG and IGA. EGD scheduled for 07/14/17

## 2017-06-05 ENCOUNTER — Encounter: Payer: Self-pay | Admitting: Gastroenterology

## 2017-06-19 ENCOUNTER — Other Ambulatory Visit (INDEPENDENT_AMBULATORY_CARE_PROVIDER_SITE_OTHER): Payer: BLUE CROSS/BLUE SHIELD

## 2017-06-19 ENCOUNTER — Ambulatory Visit (AMBULATORY_SURGERY_CENTER): Payer: Self-pay

## 2017-06-19 VITALS — Ht 67.0 in | Wt 241.8 lb

## 2017-06-19 DIAGNOSIS — D509 Iron deficiency anemia, unspecified: Secondary | ICD-10-CM | POA: Diagnosis not present

## 2017-06-19 DIAGNOSIS — D508 Other iron deficiency anemias: Secondary | ICD-10-CM

## 2017-06-19 DIAGNOSIS — D649 Anemia, unspecified: Secondary | ICD-10-CM

## 2017-06-19 LAB — CBC WITH DIFFERENTIAL/PLATELET
BASOS PCT: 0.9 % (ref 0.0–3.0)
Basophils Absolute: 0 10*3/uL (ref 0.0–0.1)
EOS PCT: 3.1 % (ref 0.0–5.0)
Eosinophils Absolute: 0.2 10*3/uL (ref 0.0–0.7)
HCT: 40.3 % (ref 36.0–46.0)
HEMOGLOBIN: 13.5 g/dL (ref 12.0–15.0)
LYMPHS ABS: 1.5 10*3/uL (ref 0.7–4.0)
Lymphocytes Relative: 30.5 % (ref 12.0–46.0)
MCHC: 33.6 g/dL (ref 30.0–36.0)
MCV: 83.2 fl (ref 78.0–100.0)
MONOS PCT: 14.5 % — AB (ref 3.0–12.0)
Monocytes Absolute: 0.7 10*3/uL (ref 0.1–1.0)
Neutro Abs: 2.4 10*3/uL (ref 1.4–7.7)
Neutrophils Relative %: 51 % (ref 43.0–77.0)
Platelets: 194 10*3/uL (ref 150.0–400.0)
RBC: 4.84 Mil/uL (ref 3.87–5.11)
RDW: 20.3 % — AB (ref 11.5–15.5)
WBC: 4.8 10*3/uL (ref 4.0–10.5)

## 2017-06-19 NOTE — Progress Notes (Signed)
Per pt, no allergies to soy or egg products.Pt not taking any weight loss meds or using  O2 at home.  Pt refused emmi video. 

## 2017-06-20 ENCOUNTER — Other Ambulatory Visit: Payer: BLUE CROSS/BLUE SHIELD

## 2017-06-20 DIAGNOSIS — D509 Iron deficiency anemia, unspecified: Secondary | ICD-10-CM

## 2017-06-20 LAB — IBC PANEL
Iron: 41 ug/dL — ABNORMAL LOW (ref 42–145)
SATURATION RATIOS: 9.7 % — AB (ref 20.0–50.0)
Transferrin: 303 mg/dL (ref 212.0–360.0)

## 2017-06-20 LAB — HEMOCCULT SLIDES (X 3 CARDS)
Fecal Occult Blood: NEGATIVE
OCCULT 1: NEGATIVE
OCCULT 2: NEGATIVE
OCCULT 3: NEGATIVE
OCCULT 4: NEGATIVE
OCCULT 5: NEGATIVE

## 2017-06-20 LAB — IGA: IGA: 79 mg/dL (ref 68–378)

## 2017-06-20 LAB — TISSUE TRANSGLUTAMINASE, IGA: (TTG) AB, IGA: 1 U/mL

## 2017-06-23 ENCOUNTER — Telehealth: Payer: BLUE CROSS/BLUE SHIELD | Admitting: Family

## 2017-06-23 DIAGNOSIS — J028 Acute pharyngitis due to other specified organisms: Secondary | ICD-10-CM

## 2017-06-23 DIAGNOSIS — B9689 Other specified bacterial agents as the cause of diseases classified elsewhere: Secondary | ICD-10-CM

## 2017-06-23 MED ORDER — AZITHROMYCIN 250 MG PO TABS: ORAL_TABLET | ORAL | 0 refills | Status: DC

## 2017-06-23 MED ORDER — BENZONATATE 100 MG PO CAPS: 100.0000 mg | ORAL_CAPSULE | Freq: Three times a day (TID) | ORAL | 0 refills | Status: DC | PRN

## 2017-06-23 MED ORDER — PREDNISONE 5 MG PO TABS: 5.0000 mg | ORAL_TABLET | ORAL | 0 refills | Status: DC

## 2017-06-23 NOTE — Progress Notes (Signed)
Thank you for the details you included in the comment boxes. Those details are very helpful in determining the best course of treatment for you and help [place] to provide the best care.  We are sorry that you are not feeling well.  Here is how we plan to help!  Based on your presentation I believe you most likely have A cough due to bacteria.  When patients have a fever and a productive cough with a change in color or increased sputum production, we are concerned about bacterial bronchitis.  If left untreated it can progress to pneumonia.  If your symptoms do not improve with your treatment plan it is important that you contact your provider.   I have prescribed Azithromyin 250 mg: two tablets now and then one tablet daily for 4 additonal days    In addition you may use A non-prescription cough medication called Mucinex DM: take 2 tablets every 12 hours. and A prescription cough medication called Tessalon Perles . You may take 1-2 capsules every 8 hours as needed for your cough.   Directions for 6 day taper: Day 1: 2 tablets before breakfast, 1 after both lunch & dinner and 2 at bedtime Day 2: 1 tab before breakfast, 1 after both lunch & dinner and 2 at bedtime Day 3: 1 tab at each meal & 1 at bedtime Day 4: 1 tab at breakfast, 1 at lunch, 1 at bedtime Day 5: 1 tab at breakfast & 1 tab at bedtime Day 6: 1 tab at breakfast   From your responses in the eVisit questionnaire you describe inflammation in the upper respiratory tract which is causing a significant cough.  This is commonly called Bronchitis and has four common causes:    Allergies  Viral Infections  Acid Reflux  Bacterial Infection Allergies, viruses and acid reflux are treated by controlling symptoms or eliminating the cause. An example might be a cough caused by taking certain blood pressure medications. You stop the cough by changing the medication. Another example might be a cough caused by acid reflux. Controlling the reflux  helps control the cough.  USE OF BRONCHODILATOR ("RESCUE") INHALERS: There is a risk from using your bronchodilator too frequently.  The risk is that over-reliance on a medication which only relaxes the muscles surrounding the breathing tubes can reduce the effectiveness of medications prescribed to reduce swelling and congestion of the tubes themselves.  Although you feel brief relief from the bronchodilator inhaler, your asthma may actually be worsening with the tubes becoming more swollen and filled with mucus.  This can delay other crucial treatments, such as oral steroid medications. If you need to use a bronchodilator inhaler daily, several times per day, you should discuss this with your provider.  There are probably better treatments that could be used to keep your asthma under control.     HOME CARE . Only take medications as instructed by your medical team. . Complete the entire course of an antibiotic. . Drink plenty of fluids and get plenty of rest. . Avoid close contacts especially the very young and the elderly . Cover your mouth if you cough or cough into your sleeve. . Always remember to wash your hands . A steam or ultrasonic humidifier can help congestion.   GET HELP RIGHT AWAY IF: . You develop worsening fever. . You become short of breath . You cough up blood. . Your symptoms persist after you have completed your treatment plan MAKE SURE YOU   Understand these  instructions.  Will watch your condition.  Will get help right away if you are not doing well or get worse.  Your e-visit answers were reviewed by a board certified advanced clinical practitioner to complete your personal care plan.  Depending on the condition, your plan could have included both over the counter or prescription medications. If there is a problem please reply  once you have received a response from your provider. Your safety is important to [place].  If you have drug allergies check your prescription  carefully.    You can use MyChart to ask questions about today's visit, request a non-urgent call back, or ask for a work or school excuse for 24 hours related to this e-Visit. If it has been greater than 24 hours you will need to follow up with your provider, or enter a new e-Visit to address those concerns. You will get an e-mail in the next two days asking about your experience.  I hope that your e-visit has been valuable and will speed your recovery. Thank you for using e-visits.

## 2017-06-26 ENCOUNTER — Other Ambulatory Visit: Payer: Self-pay | Admitting: Family Medicine

## 2017-06-26 DIAGNOSIS — D649 Anemia, unspecified: Secondary | ICD-10-CM

## 2017-07-03 ENCOUNTER — Other Ambulatory Visit: Payer: BLUE CROSS/BLUE SHIELD

## 2017-07-07 ENCOUNTER — Encounter: Payer: Self-pay | Admitting: Gastroenterology

## 2017-07-14 ENCOUNTER — Encounter: Payer: Self-pay | Admitting: Gastroenterology

## 2017-07-14 ENCOUNTER — Ambulatory Visit (AMBULATORY_SURGERY_CENTER): Payer: BLUE CROSS/BLUE SHIELD | Admitting: Gastroenterology

## 2017-07-14 ENCOUNTER — Other Ambulatory Visit: Payer: Self-pay

## 2017-07-14 VITALS — BP 128/81 | HR 66 | Temp 97.1°F | Resp 13 | Ht 67.0 in | Wt 241.0 lb

## 2017-07-14 DIAGNOSIS — D508 Other iron deficiency anemias: Secondary | ICD-10-CM

## 2017-07-14 DIAGNOSIS — K219 Gastro-esophageal reflux disease without esophagitis: Secondary | ICD-10-CM | POA: Diagnosis not present

## 2017-07-14 DIAGNOSIS — K317 Polyp of stomach and duodenum: Secondary | ICD-10-CM

## 2017-07-14 MED ORDER — SODIUM CHLORIDE 0.9 % IV SOLN: 500.0000 mL | Freq: Once | INTRAVENOUS | Status: DC

## 2017-07-14 NOTE — Progress Notes (Signed)
Report to PACU, RN, vss, BBS= Clear.  

## 2017-07-14 NOTE — Op Note (Signed)
[facility] Patient Name: Dawn [name] Procedure Date: 07/14/2017 10:08 AM MRN: [id number] Endoscopist: Meryl Dare , MD Age: 57 Referring MD:  Date of Birth: [birth year] Gender: Female Account #: [id number] Procedure:                Upper GI endoscopy Indications:              Iron deficiency anemia Medicines:                Monitored Anesthesia Care Procedure:                Pre-Anesthesia Assessment:                           - Prior to the procedure, a History and Physical                            was performed, and patient medications and                            allergies were reviewed. The patient's tolerance of                            previous anesthesia was also reviewed. The risks                            and benefits of the procedure and the sedation                            options and risks were discussed with the patient.                            All questions were answered, and informed consent                            was obtained. Prior Anticoagulants: The patient has                            taken no previous anticoagulant or antiplatelet                            agents. ASA Grade Assessment: II - A patient with                            mild systemic disease. After reviewing the risks                            and benefits, the patient was deemed in                            satisfactory condition to undergo the procedure.                           After obtaining informed consent, the endoscope was  passed under direct vision. Throughout the                            procedure, the patient's blood pressure, pulse, and                            oxygen saturations were monitored continuously. The                            Endoscope was introduced through the mouth, and                            advanced to the second part of duodenum. The upper                            GI endoscopy was accomplished  without difficulty.                            The patient tolerated the procedure well. Scope In: Scope Out: Findings:                 The examined esophagus was normal.                           Multiple 4 to 7 mm sessile polyps with no bleeding                            and no stigmata of recent bleeding were found in                            the gastric fundus and in the gastric body.                            Biopsies were taken with a cold forceps for                            histology. All the polyps were pale except for one                            5 mm polyp in in the greater curvature of the body                            was erythematous and was submitted separately to                            histology.                           A medium-sized hiatal hernia was present.                           The exam of the stomach was otherwise normal.  The duodenal bulb and second portion of the                            duodenum were normal. Complications:            No immediate complications. Estimated Blood Loss:     Estimated blood loss was minimal. Impression:               - Normal esophagus.                           - Multiple gastric polyps. Biopsied.                           - Medium-sized hiatal hernia.                           - Normal duodenal bulb and second portion of the                            duodenum. Recommendation:           - Patient has a contact number available for                            emergencies. The signs and symptoms of potential                            delayed complications were discussed with the                            patient. Return to normal activities tomorrow.                            Written discharge instructions were provided to the                            patient.                           - Resume previous diet.                           - Continue present medications.                            - Await pathology results. Meryl Dare, MD 07/14/2017 10:24:50 AM This report has been signed electronically.

## 2017-07-14 NOTE — Progress Notes (Signed)
Called to room to assist during endoscopic procedure.  Patient ID and intended procedure confirmed with present staff. Received instructions for my participation in the procedure from the performing physician.  

## 2017-07-14 NOTE — Patient Instructions (Signed)
Impression/Recommendations:  Hiatal hernia handout given to patient.  Resume previous diet. Continue present medications.  Await pathology results.  YOU HAD AN ENDOSCOPIC PROCEDURE TODAY AT THE [facility]:   Refer to the procedure report that was given to you for any specific questions about what was found during the examination.  If the procedure report does not answer your questions, please call your gastroenterologist to clarify.  If you requested that your care partner not be given the details of your procedure findings, then the procedure report has been included in a sealed envelope for you to review at your convenience later.  YOU SHOULD EXPECT: Some feelings of bloating in the abdomen. Passage of more gas than usual.  Walking can help get rid of the air that was put into your GI tract during the procedure and reduce the bloating. If you had a lower endoscopy (such as a colonoscopy or flexible sigmoidoscopy) you may notice spotting of blood in your stool or on the toilet paper. If you underwent a bowel prep for your procedure, you may not have a normal bowel movement for a few days.  Please Note:  You might notice some irritation and congestion in your nose or some drainage.  This is from the oxygen used during your procedure.  There is no need for concern and it should clear up in a day or so.  SYMPTOMS TO REPORT IMMEDIATELY:  Following upper endoscopy (EGD)  Vomiting of blood or coffee ground material  New chest pain or pain under the shoulder blades  Painful or persistently difficult swallowing  New shortness of breath  Fever of 100F or higher  Black, tarry-looking stools  For urgent or emergent issues, a gastroenterologist can be reached at any hour by calling [phone].   DIET:  We do recommend a small meal at first, but then you may proceed to your regular diet.  Drink plenty of fluids but you should avoid alcoholic beverages for 24 hours.  ACTIVITY:   You should plan to take it easy for the rest of today and you should NOT DRIVE or use heavy machinery until tomorrow (because of the sedation medicines used during the test).    FOLLOW UP: Our staff will call the number listed on your records the next business day following your procedure to check on you and address any questions or concerns that you may have regarding the information given to you following your procedure. If we do not reach you, we will leave a message.  However, if you are feeling well and you are not experiencing any problems, there is no need to return our call.  We will assume that you have returned to your regular daily activities without incident.  If any biopsies were taken you will be contacted by phone or by letter within the next 1-3 weeks.  Please call us at [phone] if you have not heard about the biopsies in 3 weeks.    SIGNATURES/CONFIDENTIALITY: You and/or your care partner have signed paperwork which will be entered into your electronic medical record.  These signatures attest to the fact that that the information above on your After Visit Summary has been reviewed and is understood.  Full responsibility of the confidentiality of this discharge information lies with you and/or your care-partner.

## 2017-07-14 NOTE — Progress Notes (Signed)
Pt's states no medical or surgical changes since previsit or office visit. 

## 2017-07-15 ENCOUNTER — Telehealth: Payer: Self-pay

## 2017-07-15 ENCOUNTER — Telehealth: Payer: Self-pay | Admitting: *Deleted

## 2017-07-15 NOTE — Telephone Encounter (Signed)
Called [phone] and left a messaged we tried to reach pt for a follow up call. maw

## 2017-07-15 NOTE — Telephone Encounter (Signed)
  Follow up Call-  Call back number 07/14/2017 06/03/2017  Post procedure Call Back phone  # [phone]  Permission to leave phone message Yes Yes  Some recent data might be hidden     Patient questions:  Do you have a fever, pain , or abdominal swelling? No. Pain Score  0 *  Have you tolerated food without any problems? Yes.    Have you been able to return to your normal activities? Yes.    Do you have any questions about your discharge instructions: Diet   No. Medications  No. Follow up visit  No.  Do you have questions or concerns about your Care? No  Actions: * If pain score is 4 or above: No action needed, pain <4.

## 2017-07-20 ENCOUNTER — Encounter: Payer: Self-pay | Admitting: Gastroenterology

## 2017-07-29 DIAGNOSIS — D485 Neoplasm of uncertain behavior of skin: Secondary | ICD-10-CM | POA: Diagnosis not present

## 2017-07-29 DIAGNOSIS — L814 Other melanin hyperpigmentation: Secondary | ICD-10-CM | POA: Diagnosis not present

## 2017-08-25 ENCOUNTER — Encounter: Payer: Self-pay | Admitting: Family Medicine

## 2017-08-25 ENCOUNTER — Ambulatory Visit: Payer: BLUE CROSS/BLUE SHIELD | Admitting: Family Medicine

## 2017-08-25 ENCOUNTER — Ambulatory Visit (INDEPENDENT_AMBULATORY_CARE_PROVIDER_SITE_OTHER)
Admission: RE | Admit: 2017-08-25 | Discharge: 2017-08-25 | Disposition: A | Discharge status: Home / Self Care | Payer: BLUE CROSS/BLUE SHIELD | Source: Ambulatory Visit | Attending: Family Medicine | Admitting: Family Medicine

## 2017-08-25 VITALS — BP 116/76 | HR 75 | Temp 97.6°F | Ht 67.0 in | Wt 242.5 lb

## 2017-08-25 DIAGNOSIS — R202 Paresthesia of skin: Secondary | ICD-10-CM

## 2017-08-25 DIAGNOSIS — D649 Anemia, unspecified: Secondary | ICD-10-CM | POA: Diagnosis not present

## 2017-08-25 DIAGNOSIS — M5030 Other cervical disc degeneration, unspecified cervical region: Secondary | ICD-10-CM | POA: Diagnosis not present

## 2017-08-25 DIAGNOSIS — M47812 Spondylosis without myelopathy or radiculopathy, cervical region: Secondary | ICD-10-CM | POA: Diagnosis not present

## 2017-08-25 DIAGNOSIS — F419 Anxiety disorder, unspecified: Secondary | ICD-10-CM

## 2017-08-25 MED ORDER — CLONAZEPAM 0.5 MG PO TABS: 0.2500 mg | ORAL_TABLET | Freq: Two times a day (BID) | ORAL | 1 refills | Status: DC | PRN

## 2017-08-25 NOTE — Patient Instructions (Addendum)
Go to the lab on the way out.  We'll contact you with your lab and xray report. Take care.  Glad to see you.  Update me as needed.   

## 2017-08-25 NOTE — Progress Notes (Signed)
She and her sister are still helping care for their mother and that is a strain.  Her mother still lives by herself with sig support from family.  D/w pt.  She is trying to work through the situation.  Rare use of BZD.  Still on ambient prn.  Taking effexor at baseline.    Anemia.  Due for f/u labs.  Prev labs d/w pt.  Off iron currently.  She has some fatigue.  Prev studies d/w pt:  EGD recently done.   - Normal esophagus. - Multiple gastric polyps. Biopsied. - Medium-sized hiatal hernia. - Normal duodenal bulb and second portion of the duodenum.  Colonoscopy 2019.  - The entire examined colon is normal on direct and retroflexion views. - No specimens collected.  1. Surgical [P], fundus and gastric body - FUNDIC GLAND POLYPS. - NEGATIVE FOR HELICOBACTER PYLORI. - NO INTESTINAL METAPLASIA, DYSPLASIA, OR MALIGNANCY. 2. Surgical [P], gastric body - FUNDIC GLAND POLYP. - NO INTESTINAL METAPLASIA, DYSPLASIA, OR MALIGNANCY.  She had some B hand tingling, concurrently.  R handed.  L hand is worse.  Tingling, with some trouble holding due to sensation not true weakness.  She has some cramping in the hands.  No neck pain.    PMH and SH reviewed  ROS: Per HPI unless specifically indicated in ROS section   Meds, vitals, and allergies reviewed.   GEN: nad, alert and oriented, slightly tearful discussing her mother's situation but regains composure.   HEENT: mucous membranes moist NECK: supple w/o LA but B paraspinal tenderness.  No midline pain.  CV: rrr PULM: ctab, no inc wob ABD: soft, +bs EXT: no edema Skin well perfused.   Tinel neg at wrist B S/S and DTRs wnl

## 2017-08-26 DIAGNOSIS — R202 Paresthesia of skin: Secondary | ICD-10-CM | POA: Insufficient documentation

## 2017-08-26 LAB — CBC WITH DIFFERENTIAL/PLATELET
BASOS PCT: 0.2 % (ref 0.0–3.0)
Basophils Absolute: 0 10*3/uL (ref 0.0–0.1)
EOS PCT: 2.7 % (ref 0.0–5.0)
Eosinophils Absolute: 0.2 10*3/uL (ref 0.0–0.7)
HEMATOCRIT: 42.2 % (ref 36.0–46.0)
Hemoglobin: 14.1 g/dL (ref 12.0–15.0)
Lymphocytes Relative: 38.4 % (ref 12.0–46.0)
Lymphs Abs: 2.4 10*3/uL (ref 0.7–4.0)
MCHC: 33.5 g/dL (ref 30.0–36.0)
MCV: 88 fl (ref 78.0–100.0)
MONOS PCT: 7.8 % (ref 3.0–12.0)
Monocytes Absolute: 0.5 10*3/uL (ref 0.1–1.0)
NEUTROS PCT: 50.9 % (ref 43.0–77.0)
Neutro Abs: 3.1 10*3/uL (ref 1.4–7.7)
PLATELETS: 240 10*3/uL (ref 150.0–400.0)
RBC: 4.79 Mil/uL (ref 3.87–5.11)
RDW: 15 % (ref 11.5–15.5)
WBC: 6.1 10*3/uL (ref 4.0–10.5)

## 2017-08-26 LAB — BASIC METABOLIC PANEL
BUN: 14 mg/dL (ref 6–23)
CHLORIDE: 100 meq/L (ref 96–112)
CO2: 31 mEq/L (ref 19–32)
Calcium: 9.4 mg/dL (ref 8.4–10.5)
Creatinine, Ser: 0.89 mg/dL (ref 0.40–1.20)
GFR: 69.47 mL/min (ref >60.00)
Glucose, Bld: 86 mg/dL (ref 70–99)
POTASSIUM: 3.6 meq/L (ref 3.5–5.1)
SODIUM: 140 meq/L (ref 135–145)

## 2017-08-26 LAB — IBC PANEL
IRON: 62 ug/dL (ref 42–145)
Saturation Ratios: 13.3 % — ABNORMAL LOW (ref 20.0–50.0)
TRANSFERRIN: 332 mg/dL (ref 212.0–360.0)

## 2017-08-26 NOTE — Assessment & Plan Note (Signed)
Less likely CTS, d/w pt about checking plain films of neck.  Okay for outpatient f/u.  ddx d/w pt.  No weakness and symmetric DTRs.   She agrees with plan.

## 2017-08-26 NOTE — Assessment & Plan Note (Signed)
Prev GI w/u noted.  Off iron currently.  Recheck labs today.  D/w pt.  See notes on labs.  She agrees. >25 minutes spent in face to face time with patient, >50% spent in counselling or coordination of care.

## 2017-08-26 NOTE — Assessment & Plan Note (Signed)
Exacerbated by caring for her mother.  Small quant rx done for BZD with prn use, continue baseline meds o/w.  Still okay for outpatient f/u.

## 2017-08-31 ENCOUNTER — Other Ambulatory Visit: Payer: Self-pay | Admitting: Family Medicine

## 2017-08-31 DIAGNOSIS — D649 Anemia, unspecified: Secondary | ICD-10-CM

## 2017-10-02 DIAGNOSIS — M25511 Pain in right shoulder: Secondary | ICD-10-CM | POA: Diagnosis not present

## 2017-10-02 DIAGNOSIS — M7541 Impingement syndrome of right shoulder: Secondary | ICD-10-CM | POA: Diagnosis not present

## 2017-10-08 ENCOUNTER — Other Ambulatory Visit: Payer: Self-pay | Admitting: Family Medicine

## 2017-10-21 DIAGNOSIS — Z13820 Encounter for screening for osteoporosis: Secondary | ICD-10-CM | POA: Diagnosis not present

## 2017-10-21 DIAGNOSIS — Z6837 Body mass index (BMI) 37.0-37.9, adult: Secondary | ICD-10-CM | POA: Diagnosis not present

## 2017-10-21 DIAGNOSIS — Z1231 Encounter for screening mammogram for malignant neoplasm of breast: Secondary | ICD-10-CM | POA: Diagnosis not present

## 2017-10-21 DIAGNOSIS — Z01419 Encounter for gynecological examination (general) (routine) without abnormal findings: Secondary | ICD-10-CM | POA: Diagnosis not present

## 2017-10-21 LAB — HM PAP SMEAR

## 2017-10-21 LAB — HM DEXA SCAN

## 2017-10-23 LAB — HM MAMMOGRAPHY

## 2017-11-06 ENCOUNTER — Other Ambulatory Visit: Payer: Self-pay | Admitting: Family Medicine

## 2017-11-06 ENCOUNTER — Other Ambulatory Visit (INDEPENDENT_AMBULATORY_CARE_PROVIDER_SITE_OTHER): Payer: BLUE CROSS/BLUE SHIELD

## 2017-11-06 DIAGNOSIS — D649 Anemia, unspecified: Secondary | ICD-10-CM | POA: Diagnosis not present

## 2017-11-06 LAB — CBC WITH DIFFERENTIAL/PLATELET
BASOS PCT: 0.5 % (ref 0.0–3.0)
Basophils Absolute: 0 10*3/uL (ref 0.0–0.1)
EOS PCT: 1.7 % (ref 0.0–5.0)
Eosinophils Absolute: 0.1 10*3/uL (ref 0.0–0.7)
HEMATOCRIT: 41.1 % (ref 36.0–46.0)
HEMOGLOBIN: 13.9 g/dL (ref 12.0–15.0)
Lymphocytes Relative: 36.5 % (ref 12.0–46.0)
Lymphs Abs: 1.8 10*3/uL (ref 0.7–4.0)
MCHC: 33.8 g/dL (ref 30.0–36.0)
MCV: 87.2 fl (ref 78.0–100.0)
Monocytes Absolute: 0.4 10*3/uL (ref 0.1–1.0)
Monocytes Relative: 7.3 % (ref 3.0–12.0)
Neutro Abs: 2.7 10*3/uL (ref 1.4–7.7)
Neutrophils Relative %: 54 % (ref 43.0–77.0)
Platelets: 218 10*3/uL (ref 150.0–400.0)
RBC: 4.71 Mil/uL (ref 3.87–5.11)
RDW: 14.7 % (ref 11.5–15.5)
WBC: 5 10*3/uL (ref 4.0–10.5)

## 2017-11-06 LAB — IRON: IRON: 76 ug/dL (ref 42–145)

## 2017-11-06 NOTE — Telephone Encounter (Signed)
Electronic refill request. Zolpidem Last office visit:   08/25/2017 Last Filled:     30 tablet 2 03/26/2017  Please advise.

## 2017-11-06 NOTE — Telephone Encounter (Signed)
Sent. Thanks.   

## 2017-11-07 DIAGNOSIS — L570 Actinic keratosis: Secondary | ICD-10-CM | POA: Diagnosis not present

## 2017-11-07 DIAGNOSIS — D2272 Melanocytic nevi of left lower limb, including hip: Secondary | ICD-10-CM | POA: Diagnosis not present

## 2017-11-07 DIAGNOSIS — D2261 Melanocytic nevi of right upper limb, including shoulder: Secondary | ICD-10-CM | POA: Diagnosis not present

## 2017-11-07 DIAGNOSIS — D2271 Melanocytic nevi of right lower limb, including hip: Secondary | ICD-10-CM | POA: Diagnosis not present

## 2017-11-07 DIAGNOSIS — Z808 Family history of malignant neoplasm of other organs or systems: Secondary | ICD-10-CM | POA: Diagnosis not present

## 2017-11-14 DIAGNOSIS — M7541 Impingement syndrome of right shoulder: Secondary | ICD-10-CM | POA: Diagnosis not present

## 2017-11-24 DIAGNOSIS — M25511 Pain in right shoulder: Secondary | ICD-10-CM | POA: Diagnosis not present

## 2017-11-27 ENCOUNTER — Ambulatory Visit (INDEPENDENT_AMBULATORY_CARE_PROVIDER_SITE_OTHER): Payer: BLUE CROSS/BLUE SHIELD

## 2017-11-27 DIAGNOSIS — Z23 Encounter for immunization: Secondary | ICD-10-CM

## 2017-12-01 DIAGNOSIS — M7541 Impingement syndrome of right shoulder: Secondary | ICD-10-CM | POA: Diagnosis not present

## 2018-01-12 ENCOUNTER — Other Ambulatory Visit: Payer: Self-pay | Admitting: Family Medicine

## 2018-03-11 ENCOUNTER — Other Ambulatory Visit: Payer: Self-pay | Admitting: Family Medicine

## 2018-03-12 ENCOUNTER — Other Ambulatory Visit: Payer: Self-pay | Admitting: *Deleted

## 2018-03-12 NOTE — Telephone Encounter (Deleted)
Electronic refill request. Clonazepam Last office visit:   08/25/17 Last Filled:    10 tablet 1 08/25/2017  Please advise.

## 2018-03-13 ENCOUNTER — Other Ambulatory Visit: Payer: Self-pay | Admitting: Family Medicine

## 2018-03-13 NOTE — Telephone Encounter (Signed)
Electronic refill request. Clonazepam Last office visit:   08/25/2017 Last Filled:    10 tablet 1 08/25/2017  Please advise.

## 2018-03-15 NOTE — Telephone Encounter (Signed)
Sent. Thanks.   

## 2018-04-09 ENCOUNTER — Encounter: Payer: Self-pay | Admitting: Family Medicine

## 2018-05-14 ENCOUNTER — Other Ambulatory Visit: Payer: Self-pay | Admitting: Family Medicine

## 2018-05-14 NOTE — Telephone Encounter (Signed)
Electronic refill request. Zolpidem Last office visit:   08/25/2017 Last Filled:    30 tablet 2 11/06/2017  Please advise.

## 2018-05-15 NOTE — Telephone Encounter (Signed)
Sent. Thanks.   

## 2018-07-15 ENCOUNTER — Other Ambulatory Visit: Payer: Self-pay | Admitting: Family Medicine

## 2018-08-05 ENCOUNTER — Encounter: Payer: Self-pay | Admitting: Family Medicine

## 2018-08-13 ENCOUNTER — Other Ambulatory Visit: Payer: Self-pay

## 2018-08-13 ENCOUNTER — Ambulatory Visit (INDEPENDENT_AMBULATORY_CARE_PROVIDER_SITE_OTHER): Payer: BC Managed Care – PPO | Admitting: Family Medicine

## 2018-08-13 ENCOUNTER — Encounter: Payer: Self-pay | Admitting: Family Medicine

## 2018-08-13 DIAGNOSIS — Z7189 Other specified counseling: Secondary | ICD-10-CM

## 2018-08-13 DIAGNOSIS — Z0000 Encounter for general adult medical examination without abnormal findings: Secondary | ICD-10-CM

## 2018-08-13 DIAGNOSIS — I10 Essential (primary) hypertension: Secondary | ICD-10-CM

## 2018-08-13 DIAGNOSIS — G4700 Insomnia, unspecified: Secondary | ICD-10-CM

## 2018-08-13 DIAGNOSIS — F419 Anxiety disorder, unspecified: Secondary | ICD-10-CM

## 2018-08-13 MED ORDER — VENLAFAXINE HCL ER 150 MG PO CP24: 150.0000 mg | ORAL_CAPSULE | Freq: Every day | ORAL | 3 refills | Status: DC

## 2018-08-13 MED ORDER — ESOMEPRAZOLE MAGNESIUM 20 MG PO CPDR: 40.0000 mg | DELAYED_RELEASE_CAPSULE | Freq: Every day | ORAL | Status: DC

## 2018-08-13 MED ORDER — LISINOPRIL-HYDROCHLOROTHIAZIDE 20-25 MG PO TABS: 1.0000 | ORAL_TABLET | Freq: Every day | ORAL | 3 refills | Status: DC

## 2018-08-13 NOTE — Assessment & Plan Note (Signed)
Her mother is in a facility and they have restrictions given the pandemic.  Still on baseline SNRI with rare PRN BZD.  No ADE on meds.  No SI/HI.  Continue as is.

## 2018-08-13 NOTE — Progress Notes (Signed)
Virtual visit completed through WebEx or similar program Patient location: home  Provider location: Financial controller at [facility], office   Pandemic considerations d/w pt.   Limitations and rationale for visit method d/w patient.  Patient agreed to proceed.   CC: CPE.    HPI:   Tetanus d/w pt.  Likely >10 years. We can do at next visit.   Flu done yearly.  D/w pt.   PNA due at 65.   Shingles out stock D/w patient [id number] for colon cancer screening, including IFOB vs. colonoscopy.  Risks and benefits of both were discussed and patient voiced understanding.  Pt elects for: Mammogram.  [facility]  [facility].  Pap at [facility].  Living will d/w pt.  Husband designated if patient were incapacitated.    Pandemic consideration d/w pt.    Insomnia.  No ADE on med.  Some relief with med.  Compliant.  Used nightly.    Anxiety.  Her mother is in a facility and they have restrictions given the pandemic.  Still on baseline SNRI with rare PRN BZD.  No ADE on meds.  No SI/HI.    Hypertension:  Using medication without problems or lightheadedness:yes  Chest pain with exertion:no Edema:no Short of breath:no Labs d/w pt.   Diet and exercise d/w pt.    PMH, SH, FH reviewed.    Meds and allergies reviewed.   ROS: Per HPI unless specifically indicated in ROS section   NAD Speech wnl  A/P:  Tetanus d/w pt.  Likely >10 years. We can do at next visit.   Flu done yearly.  D/w pt.   PNA due at 65.   Shingles out stock D/w patient [id number] for colon cancer screening, including IFOB vs. colonoscopy.  Risks and benefits of both were discussed and patient voiced understanding.  Pt elects for: Mammogram.  [facility].  DXA [facility].  Pap at [facility].  Requesting records.   Living will d/w pt.  Husband designated if patient were incapacitated.    Pandemic consideration  d/w pt.    Insomnia.  No ADE on med.  Some relief with med.  Compliant.  Used nightly.  Continue as is.    Anxiety.  Her mother is in a facility and they have restrictions given the pandemic.  Still on baseline SNRI with rare PRN BZD.  No ADE on meds.  No SI/HI.  Continue as is.    Hypertension:  See scanned labs.  Labs d/w pt.   Diet and exercise d/w pt.   No change in meds. She agrees.

## 2018-08-13 NOTE — Assessment & Plan Note (Signed)
Living will d/w pt.  Husband designated if patient were incapacitated.  

## 2018-08-13 NOTE — Assessment & Plan Note (Signed)
No ADE on med.  Some relief with med.  Compliant.  Used nightly.  Continue as is.

## 2018-08-13 NOTE — Assessment & Plan Note (Signed)
See scanned labs.  Labs d/w pt.   Diet and exercise d/w pt.   No change in meds. She agrees.

## 2018-08-13 NOTE — Assessment & Plan Note (Signed)
Tetanus d/w pt.  Likely >10 years. We can do at next visit.   Flu done yearly.  D/w pt.   PNA due at 65.   Shingles out stock D/w patient [id number] for colon cancer screening, including IFOB vs. colonoscopy.  Risks and benefits of both were discussed and patient voiced understanding.  Pt elects for: Mammogram.  [facility]  DXA [facility].  [facility]ap at [facility].  Requesting records.   Living will d/w pt.  Husband designated if patient were incapacitated.

## 2018-09-15 ENCOUNTER — Encounter: Payer: Self-pay | Admitting: Family Medicine

## 2018-09-17 ENCOUNTER — Telehealth: Payer: BC Managed Care – PPO | Admitting: Physician Assistant

## 2018-09-17 DIAGNOSIS — B9689 Other specified bacterial agents as the cause of diseases classified elsewhere: Secondary | ICD-10-CM

## 2018-09-17 MED ORDER — DOXYCYCLINE HYCLATE 100 MG PO CAPS: 100.0000 mg | ORAL_CAPSULE | Freq: Two times a day (BID) | ORAL | 0 refills | Status: DC

## 2018-09-17 NOTE — Progress Notes (Signed)

## 2018-09-22 ENCOUNTER — Ambulatory Visit (INDEPENDENT_AMBULATORY_CARE_PROVIDER_SITE_OTHER): Payer: BC Managed Care – PPO | Admitting: Family Medicine

## 2018-09-22 ENCOUNTER — Encounter: Payer: Self-pay | Admitting: Family Medicine

## 2018-09-22 DIAGNOSIS — J0140 Acute pansinusitis, unspecified: Secondary | ICD-10-CM

## 2018-09-22 MED ORDER — LEVOFLOXACIN 500 MG PO TABS: 500.0000 mg | ORAL_TABLET | Freq: Every day | ORAL | 0 refills | Status: DC

## 2018-09-22 NOTE — Progress Notes (Signed)
I connected with Varney Baas on 09/22/18 at 11:00 AM EDT by video and verified that I am speaking with the correct person using two identifiers.   I discussed the limitations, risks, security and privacy concerns of performing an evaluation and management service by video and the availability of in person appointments. I also discussed with the patient that there may be a patient responsible charge related to this service. The patient expressed understanding and agreed to proceed.  Patient location: Work Provider Location: [place] Participants: Lesleigh Noe and Katharine Look Honeycott Leavelle   Subjective:     Dawn [name] is a 58 y.o. female presenting for Headache (eye pressure, head pressure, left ear pain. Cough in the morning usually. On Doxy since 09/17/2018 reated through e visit. Symptoms the same and sometimes feel worse.)     Headache  This is a new problem. The current episode started 1 to 4 weeks ago. Associated symptoms include coughing, ear pain, sinus pressure and a sore throat. Pertinent negatives include no neck pain.  Sinusitis This is a new problem. The current episode started 1 to 4 weeks ago. The problem is unchanged. There has been no fever. Associated symptoms include congestion, coughing, ear pain, headaches, sinus pressure and a sore throat. Pertinent negatives include no chills, neck pain, shortness of breath or sneezing. Past treatments include antibiotics, spray decongestants and oral decongestants. The treatment provided mild relief.   First day of symptoms were 2 weeks ago. Has been doing doxycycline since 09/17/2018 w/o improvement  Tried irrigation in the past but not recently   Review of Systems  Constitutional: Negative for chills.  HENT: Positive for congestion, ear pain, sinus pressure and sore throat. Negative for sneezing.   Respiratory: Positive for cough. Negative for shortness of breath.   Musculoskeletal: Negative  for neck pain.  Neurological: Positive for headaches.     Social History   Tobacco Use  Smoking Status Never Smoker  Smokeless Tobacco Never Used        Objective:   BP Readings from Last 3 Encounters:  08/25/17 116/76  07/14/17 128/81  06/03/17 120/84   Wt Readings from Last 3 Encounters:  08/25/17 242 lb 8 oz (110 kg)  07/14/17 241 lb (109.3 kg)  06/19/17 241 lb 12.8 oz (109.7 kg)   Pt unable to obtain VS  Physical Exam Constitutional:      Appearance: Normal appearance. She is not ill-appearing.  HENT:     Head: Normocephalic and atraumatic.     Right Ear: External ear normal.     Left Ear: External ear normal.  Eyes:     Conjunctiva/sclera: Conjunctivae normal.  Pulmonary:     Effort: Pulmonary effort is normal. No respiratory distress.  Neurological:     Mental Status: She is alert. Mental status is at baseline.  Psychiatric:        Mood and Affect: Mood normal.        Behavior: Behavior normal.        Thought Content: Thought content normal.        Judgment: Judgment normal.        Assessment & Plan:   Problem List Items Addressed This Visit    None    Visit Diagnoses    Acute pansinusitis, recurrence not specified    -  Primary   Relevant Medications   levofloxacin (LEVAQUIN) 500 MG tablet     Pt on day 5 of doxy w/ little improvement. Symptoms  still seem consistent with sinusitis though exam limited.   Advised saline rinse and allergy medication (to see if it is more allergy driving) and 2 more days of doxy.   If not improving - advised changing to Levofloxacin If no improvement after 1 week of levofloxacin recommended reaching out to PCP to see if appointment or referral/imaging needed.   Return if symptoms worsen or fail to improve.  Lynnda ChildJessica R Cody, MD

## 2018-11-04 DIAGNOSIS — Z6837 Body mass index (BMI) 37.0-37.9, adult: Secondary | ICD-10-CM | POA: Diagnosis not present

## 2018-11-04 DIAGNOSIS — Z01419 Encounter for gynecological examination (general) (routine) without abnormal findings: Secondary | ICD-10-CM | POA: Diagnosis not present

## 2018-11-04 DIAGNOSIS — Z1231 Encounter for screening mammogram for malignant neoplasm of breast: Secondary | ICD-10-CM | POA: Diagnosis not present

## 2018-11-17 ENCOUNTER — Other Ambulatory Visit: Payer: Self-pay | Admitting: Family Medicine

## 2018-11-18 NOTE — Telephone Encounter (Signed)
Last office visit 09/22/2018 with Dr. Einar Pheasant for pansinusitis.  Last refilled 05/15/2018 for #30 with 2 refills.  No future appointments with PCP.  Ok to refill?

## 2018-11-19 NOTE — Telephone Encounter (Signed)
Sent. Thanks.   

## 2018-11-20 DIAGNOSIS — D2262 Melanocytic nevi of left upper limb, including shoulder: Secondary | ICD-10-CM | POA: Diagnosis not present

## 2018-11-20 DIAGNOSIS — L570 Actinic keratosis: Secondary | ICD-10-CM | POA: Diagnosis not present

## 2018-11-20 DIAGNOSIS — D2261 Melanocytic nevi of right upper limb, including shoulder: Secondary | ICD-10-CM | POA: Diagnosis not present

## 2018-11-20 DIAGNOSIS — Z808 Family history of malignant neoplasm of other organs or systems: Secondary | ICD-10-CM | POA: Diagnosis not present

## 2018-11-20 DIAGNOSIS — D2271 Melanocytic nevi of right lower limb, including hip: Secondary | ICD-10-CM | POA: Diagnosis not present

## 2018-11-20 DIAGNOSIS — D2272 Melanocytic nevi of left lower limb, including hip: Secondary | ICD-10-CM | POA: Diagnosis not present

## 2018-11-20 DIAGNOSIS — D485 Neoplasm of uncertain behavior of skin: Secondary | ICD-10-CM | POA: Diagnosis not present

## 2018-11-26 ENCOUNTER — Ambulatory Visit: Payer: BC Managed Care – PPO

## 2018-12-14 DIAGNOSIS — M7541 Impingement syndrome of right shoulder: Secondary | ICD-10-CM | POA: Diagnosis not present

## 2018-12-14 DIAGNOSIS — M25511 Pain in right shoulder: Secondary | ICD-10-CM | POA: Diagnosis not present

## 2018-12-29 DIAGNOSIS — D485 Neoplasm of uncertain behavior of skin: Secondary | ICD-10-CM | POA: Diagnosis not present

## 2018-12-29 DIAGNOSIS — D2262 Melanocytic nevi of left upper limb, including shoulder: Secondary | ICD-10-CM | POA: Diagnosis not present

## 2019-02-24 ENCOUNTER — Ambulatory Visit: Payer: BC Managed Care – PPO | Attending: Internal Medicine

## 2019-02-24 ENCOUNTER — Telehealth: Payer: BC Managed Care – PPO | Admitting: Family

## 2019-02-24 DIAGNOSIS — J069 Acute upper respiratory infection, unspecified: Secondary | ICD-10-CM

## 2019-02-24 DIAGNOSIS — Z20828 Contact with and (suspected) exposure to other viral communicable diseases: Secondary | ICD-10-CM | POA: Diagnosis not present

## 2019-02-24 DIAGNOSIS — Z20822 Contact with and (suspected) exposure to covid-19: Secondary | ICD-10-CM

## 2019-02-24 MED ORDER — FLUTICASONE PROPIONATE 50 MCG/ACT NA SUSP: 2.0000 | Freq: Every day | NASAL | 6 refills | Status: DC

## 2019-02-24 MED ORDER — BENZONATATE 100 MG PO CAPS: 100.0000 mg | ORAL_CAPSULE | Freq: Three times a day (TID) | ORAL | 0 refills | Status: DC | PRN

## 2019-02-24 NOTE — Progress Notes (Signed)
We are sorry you are not feeling well.  Here is how we plan to help!  Based on what you have shared with me, it looks like you may have a viral upper respiratory infection.  Upper respiratory infections are caused by a large number of viruses; however, rhinovirus is the most common cause.   Symptoms vary from person to person, with common symptoms including sore throat, cough, fatigue or lack of energy and feeling of general discomfort.  A low-grade fever of up to 100.4 may present, but is often uncommon.  Symptoms vary however, and are closely related to a person's age or underlying illnesses.  The most common symptoms associated with an upper respiratory infection are nasal discharge or congestion, cough, sneezing, headache and pressure in the ears and face.  These symptoms usually persist for about 3 to 10 days, but can last up to 2 weeks.  It is important to know that upper respiratory infections do not cause serious illness or complications in most cases.    Upper respiratory infections can be transmitted from person to person, with the most common method of transmission being a person's hands.  The virus is able to live on the skin and can infect other persons for up to 2 hours after direct contact.  Also, these can be transmitted when someone coughs or sneezes; thus, it is important to cover the mouth to reduce this risk.  To keep the spread of the illness at [place], good hand hygiene is very important.  This is an infection that is most likely caused by a virus. There are no specific treatments other than to help you with the symptoms until the infection runs its course.  We are sorry you are not feeling well.  Here is how we plan to help!   For nasal congestion, you may use an oral decongestants such as Mucinex D or if you have glaucoma or high blood pressure use plain Mucinex.  Saline nasal spray or nasal drops can help and can safely be used as often as needed for congestion.  For your congestion,  I have prescribed Fluticasone nasal spray one spray in each nostril twice a day  If you do not have a history of heart disease, hypertension, diabetes or thyroid disease, prostate/bladder issues or glaucoma, you may also use Sudafed to treat nasal congestion.  It is highly recommended that you consult with a pharmacist or your primary care physician to ensure this medication is safe for you to take.     If you have a cough, you may use cough suppressants such as Delsym and Robitussin.  If you have glaucoma or high blood pressure, you can also use Coricidin HBP.   For cough I have prescribed for you A prescription cough medication called Tessalon Perles 100 mg. You may take 1-2 capsules every 8 hours as needed for cough.  You also may want to get COVID tested to rule that out.   If you have a sore or scratchy throat, use a saltwater gargle-  to  teaspoon of salt dissolved in a 4-ounce to 8-ounce glass of warm water.  Gargle the solution for approximately 15-30 seconds and then spit.  It is important not to swallow the solution.  You can also use throat lozenges/cough drops and Chloraseptic spray to help with throat pain or discomfort.  Warm or cold liquids can also be helpful in relieving throat pain.  For headache, pain or general discomfort, you can use Ibuprofen or Tylenol  as directed.   Some authorities believe that zinc sprays or the use of Echinacea may shorten the course of your symptoms.   HOME CARE . Only take medications as instructed by your medical team. . Be sure to drink plenty of fluids. Water is fine as well as fruit juices, sodas and electrolyte beverages. You may want to stay away from caffeine or alcohol. If you are nauseated, try taking small sips of liquids. How do you know if you are getting enough fluid? Your urine should be a pale yellow or almost colorless. . Get rest. . Taking a steamy shower or using a humidifier may help nasal congestion and ease sore throat pain. You  can place a towel over your head and breathe in the steam from hot water coming from a faucet. . Using a saline nasal spray works much the same way. . Cough drops, hard candies and sore throat lozenges may ease your cough. . Avoid close contacts especially the very young and the elderly . Cover your mouth if you cough or sneeze . Always remember to wash your hands.   GET HELP RIGHT AWAY IF: . You develop worsening fever. . If your symptoms do not improve within 10 days . You develop yellow or green discharge from your nose over 3 days. . You have coughing fits . You develop a severe head ache or visual changes. . You develop shortness of breath, difficulty breathing or start having chest pain . Your symptoms persist after you have completed your treatment plan  MAKE SURE YOU   Understand these instructions.  Will watch your condition.  Will get help right away if you are not doing well or get worse.  Your e-visit answers were reviewed by a board certified advanced clinical practitioner to complete your personal care plan. Depending upon the condition, your plan could have included both over the counter or prescription medications. Please review your pharmacy choice. If there is a problem, you may call our nursing hot line at and have the prescription routed to another pharmacy. Your safety is important to [place]. If you have drug allergies check your prescription carefully.   You can use MyChart to ask questions about today's visit, request a non-urgent call back, or ask for a work or school excuse for 24 hours related to this e-Visit. If it has been greater than 24 hours you will need to follow up with your provider, or enter a new e-Visit to address those concerns. You will get an e-mail in the next two days asking about your experience.  I hope that your e-visit has been valuable and will speed your recovery. Thank you for using e-visits.   Approximately 5 minutes was spent documenting  and reviewing patient's chart.

## 2019-02-25 ENCOUNTER — Telehealth: Payer: Self-pay

## 2019-02-25 LAB — NOVEL CORONAVIRUS, NAA: SARS-CoV-2, NAA: DETECTED — AB

## 2019-02-25 NOTE — Telephone Encounter (Signed)
Spoke with patient and advise on covid result and guidelines

## 2019-03-04 ENCOUNTER — Encounter: Payer: Self-pay | Admitting: Family Medicine

## 2019-03-10 ENCOUNTER — Ambulatory Visit (INDEPENDENT_AMBULATORY_CARE_PROVIDER_SITE_OTHER)
Admission: RE | Admit: 2019-03-10 | Discharge: 2019-03-10 | Disposition: A | Discharge status: Home / Self Care | Payer: BC Managed Care – PPO | Source: Ambulatory Visit

## 2019-03-10 DIAGNOSIS — R509 Fever, unspecified: Secondary | ICD-10-CM

## 2019-03-10 DIAGNOSIS — J069 Acute upper respiratory infection, unspecified: Secondary | ICD-10-CM

## 2019-03-10 DIAGNOSIS — Z8616 Personal history of COVID-19: Secondary | ICD-10-CM | POA: Diagnosis not present

## 2019-03-10 DIAGNOSIS — R05 Cough: Secondary | ICD-10-CM

## 2019-03-10 DIAGNOSIS — U071 COVID-19: Secondary | ICD-10-CM

## 2019-03-10 DIAGNOSIS — R059 Cough, unspecified: Secondary | ICD-10-CM

## 2019-03-10 MED ORDER — PROMETHAZINE-DM 6.25-15 MG/5ML PO SYRP: 5.0000 mL | ORAL_SOLUTION | Freq: Every evening | ORAL | 0 refills | Status: DC | PRN

## 2019-03-10 MED ORDER — BENZONATATE 100 MG PO CAPS: 100.0000 mg | ORAL_CAPSULE | Freq: Three times a day (TID) | ORAL | 0 refills | Status: DC | PRN

## 2019-03-10 NOTE — Discharge Instructions (Addendum)
For sore throat or cough try using a honey-based tea. Use 3 teaspoons of honey with juice squeezed from half lemon. Place shaved pieces of ginger into 1/2-1 cup of water and warm over stove top. Then mix the ingredients and repeat every 4 hours as needed. Please take Tylenol 500mg -650mg  every 6 hours. Hydrate very well with at least 2 liters of water. Eat light meals such as soups to replenish electrolytes and soft fruits, veggies. Start an antihistamine like Zyrtec (cetirizine) at 10mg  daily for postnasal drainage, sinus congestion.  You can take this together with pseudoephedrine (Sudafed) at a dose of 30 mg 3 times a day or twice daily as needed for the same kind of congestion.

## 2019-03-10 NOTE — ED Provider Notes (Signed)
Virtual Visit via Video Note:  Dawn [name]  initiated request for Telemedicine visit with Dawn [name]. I connected with Dawn [name]  on 03/10/2019 at 11:00 AM  for a synchronized telemedicine visit using a video enabled HIPPA compliant telemedicine application. I verified that I am speaking with Dawn [name]  using two identifiers. Dawn Bamberg, Dawn [name]  was physically located in a Dawn [name] and Dawn [name] was located at a different location.   The limitations of evaluation and management by telemedicine as well as the availability of in-person appointments were discussed. Patient was informed that she  may incur a bill ( including co-pay) for this virtual visit encounter. Dawn [name]  expressed understanding and gave verbal consent to proceed with virtual visit.     History of Present Illness:Dawn [name]  is a 59 y.o. female presents with persistent fevers. Had COVID 19 diagnosed ~2 weeks. Has had persistent fevers in the 99'sF. Has had occasional temp above 100'sF. Has been using Mucinex, daily sinus rinses.  Has had cough. Denies chest pain, shob.   ROS  Current Outpatient Medications  Medication Sig Dispense Refill  . benzonatate (TESSALON PERLES) 100 MG capsule Take 1 capsule (100 mg total) by mouth 3 (three) times daily as needed. 20 capsule 0  . clonazePAM (KLONOPIN) 0.5 MG tablet TAKE 1 TABLET TWICE DAILY AS NEEDED FOR ANXIETY. 10 tablet 1  . doxycycline (VIBRAMYCIN) 100 MG capsule Take 1 capsule (100 mg total) by mouth 2 (two) times daily. 20 capsule 0  . esomeprazole (NEXIUM) 20 MG capsule Take 2 capsules (40 mg total) by mouth daily at 12 noon.    . fluticasone (FLONASE) 50 MCG/ACT nasal spray Place 2 sprays into both nostrils daily. 16 g 6  . ibuprofen (ADVIL,MOTRIN) 200 MG tablet Take 200 mg by mouth every 6 (six) hours as needed.    Marland Kitchen levofloxacin (LEVAQUIN) 500 MG tablet Take 1 tablet  (500 mg total) by mouth daily. 7 tablet 0  . lisinopril-hydrochlorothiazide (ZESTORETIC) 20-25 MG tablet Take 1 tablet by mouth daily. 90 tablet 3  . venlafaxine XR (EFFEXOR-XR) 150 MG 24 hr capsule Take 1 capsule (150 mg total) by mouth daily with breakfast. 90 capsule 3  . zolpidem (AMBIEN) 10 MG tablet TAKE 1/2 TABLET AT BEDTIME. 30 tablet 2   No current facility-administered medications for this encounter.     Allergies  Allergen Reactions  . Augmentin [Amoxicillin-Pot Clavulanate]     Severe yeast infection     Past Medical History:  Diagnosis Date  . Allergy   . Anemia   . Anxiety   . Arthritis   . GERD (gastroesophageal reflux disease)   . H/O hiatal hernia   . HTN (hypertension)   . Insomnia   . Urinary incontinence   . Uterine polyp    s/p resection    Past Surgical History:  Procedure Laterality Date  . CHOLECYSTECTOMY    . COLONOSCOPY  06/03/2017  . TONSILLECTOMY    . URETHRAL SLING    . VAGINAL DELIVERY        Observations/Objective: Physical Exam Constitutional:      General: She is not in acute distress.    Appearance: Normal appearance. She is well-developed. She is not ill-appearing, toxic-appearing or diaphoretic.  Eyes:     Extraocular Movements: Extraocular movements intact.  Pulmonary:     Effort: Pulmonary effort is normal.  Neurological:     General: No focal  deficit present.     Mental Status: She is alert and oriented to person, place, and time.  Psychiatric:        Mood and Affect: Mood normal.        Behavior: Behavior normal.        Thought Content: Thought content normal.        Judgment: Judgment normal.      Assessment and Plan:  1. COVID-19   2. Viral URI   3. Fever, unspecified   4. Cough    Patient is very well-appearing, will start using supportive care including Tylenol, cough suppression medications.  She would like to return to work and I will provide her with a note for this as she has completed her quarantine  and in general has very mild symptoms. Counseled patient on potential for adverse effects with medications prescribed/recommended today, ER and return-to-clinic precautions discussed, patient verbalized understanding.    Follow Up Instructions:    I discussed the assessment and treatment plan with the patient. The patient was provided an opportunity to ask questions and all were answered. The patient agreed with the plan and demonstrated an understanding of the instructions.   The patient was advised to call back or seek an in-person evaluation if the symptoms worsen or if the condition fails to improve as anticipated.  I provided 15 minutes of non-face-to-face time during this encounter.    Jaynee Eagles, Dawn [name]  03/10/2019 11:00 AM         Jaynee Eagles, Dawn [name] 03/10/19 1115

## 2019-03-17 DIAGNOSIS — Z20828 Contact with and (suspected) exposure to other viral communicable diseases: Secondary | ICD-10-CM | POA: Diagnosis not present

## 2019-03-17 DIAGNOSIS — Z03818 Encounter for observation for suspected exposure to other biological agents ruled out: Secondary | ICD-10-CM | POA: Diagnosis not present

## 2019-03-17 DIAGNOSIS — U071 COVID-19: Secondary | ICD-10-CM | POA: Diagnosis not present

## 2019-03-25 ENCOUNTER — Other Ambulatory Visit: Payer: Self-pay | Admitting: Family Medicine

## 2019-03-29 NOTE — Telephone Encounter (Signed)
Electronic refill request. Clonazepam Last office visit:   09/22/2018 Acute - [facility] Last Filled:    10 tablet 1 03/15/2018  Please advise.

## 2019-03-30 NOTE — Telephone Encounter (Signed)
Sent. Thanks.   

## 2019-04-06 DIAGNOSIS — M25511 Pain in right shoulder: Secondary | ICD-10-CM | POA: Diagnosis not present

## 2019-04-06 DIAGNOSIS — M75111 Incomplete rotator cuff tear or rupture of right shoulder, not specified as traumatic: Secondary | ICD-10-CM | POA: Diagnosis not present

## 2019-04-06 DIAGNOSIS — R600 Localized edema: Secondary | ICD-10-CM | POA: Diagnosis not present

## 2019-04-06 DIAGNOSIS — S43431A Superior glenoid labrum lesion of right shoulder, initial encounter: Secondary | ICD-10-CM | POA: Diagnosis not present

## 2019-04-06 DIAGNOSIS — Z4889 Encounter for other specified surgical aftercare: Secondary | ICD-10-CM | POA: Diagnosis not present

## 2019-04-06 DIAGNOSIS — M7541 Impingement syndrome of right shoulder: Secondary | ICD-10-CM | POA: Diagnosis not present

## 2019-04-06 DIAGNOSIS — M24111 Other articular cartilage disorders, right shoulder: Secondary | ICD-10-CM | POA: Diagnosis not present

## 2019-04-06 DIAGNOSIS — M19011 Primary osteoarthritis, right shoulder: Secondary | ICD-10-CM | POA: Diagnosis not present

## 2019-04-06 DIAGNOSIS — G8918 Other acute postprocedural pain: Secondary | ICD-10-CM | POA: Diagnosis not present

## 2019-04-06 DIAGNOSIS — M7551 Bursitis of right shoulder: Secondary | ICD-10-CM | POA: Diagnosis not present

## 2019-04-07 DIAGNOSIS — Z4889 Encounter for other specified surgical aftercare: Secondary | ICD-10-CM | POA: Diagnosis not present

## 2019-04-07 DIAGNOSIS — R600 Localized edema: Secondary | ICD-10-CM | POA: Diagnosis not present

## 2019-04-08 DIAGNOSIS — R600 Localized edema: Secondary | ICD-10-CM | POA: Diagnosis not present

## 2019-04-08 DIAGNOSIS — Z4889 Encounter for other specified surgical aftercare: Secondary | ICD-10-CM | POA: Diagnosis not present

## 2019-04-09 DIAGNOSIS — Z4889 Encounter for other specified surgical aftercare: Secondary | ICD-10-CM | POA: Diagnosis not present

## 2019-04-09 DIAGNOSIS — R600 Localized edema: Secondary | ICD-10-CM | POA: Diagnosis not present

## 2019-04-10 DIAGNOSIS — R600 Localized edema: Secondary | ICD-10-CM | POA: Diagnosis not present

## 2019-04-10 DIAGNOSIS — Z4889 Encounter for other specified surgical aftercare: Secondary | ICD-10-CM | POA: Diagnosis not present

## 2019-04-11 DIAGNOSIS — Z4889 Encounter for other specified surgical aftercare: Secondary | ICD-10-CM | POA: Diagnosis not present

## 2019-04-11 DIAGNOSIS — R600 Localized edema: Secondary | ICD-10-CM | POA: Diagnosis not present

## 2019-04-12 DIAGNOSIS — Z4889 Encounter for other specified surgical aftercare: Secondary | ICD-10-CM | POA: Diagnosis not present

## 2019-04-12 DIAGNOSIS — R600 Localized edema: Secondary | ICD-10-CM | POA: Diagnosis not present

## 2019-04-13 DIAGNOSIS — R600 Localized edema: Secondary | ICD-10-CM | POA: Diagnosis not present

## 2019-04-13 DIAGNOSIS — Z4889 Encounter for other specified surgical aftercare: Secondary | ICD-10-CM | POA: Diagnosis not present

## 2019-04-14 DIAGNOSIS — R600 Localized edema: Secondary | ICD-10-CM | POA: Diagnosis not present

## 2019-04-14 DIAGNOSIS — Z4889 Encounter for other specified surgical aftercare: Secondary | ICD-10-CM | POA: Diagnosis not present

## 2019-04-15 DIAGNOSIS — R600 Localized edema: Secondary | ICD-10-CM | POA: Diagnosis not present

## 2019-04-15 DIAGNOSIS — Z4889 Encounter for other specified surgical aftercare: Secondary | ICD-10-CM | POA: Diagnosis not present

## 2019-04-16 DIAGNOSIS — R600 Localized edema: Secondary | ICD-10-CM | POA: Diagnosis not present

## 2019-04-16 DIAGNOSIS — Z4889 Encounter for other specified surgical aftercare: Secondary | ICD-10-CM | POA: Diagnosis not present

## 2019-04-17 DIAGNOSIS — R600 Localized edema: Secondary | ICD-10-CM | POA: Diagnosis not present

## 2019-04-17 DIAGNOSIS — Z4889 Encounter for other specified surgical aftercare: Secondary | ICD-10-CM | POA: Diagnosis not present

## 2019-04-18 DIAGNOSIS — Z4889 Encounter for other specified surgical aftercare: Secondary | ICD-10-CM | POA: Diagnosis not present

## 2019-04-18 DIAGNOSIS — R600 Localized edema: Secondary | ICD-10-CM | POA: Diagnosis not present

## 2019-04-19 DIAGNOSIS — R600 Localized edema: Secondary | ICD-10-CM | POA: Diagnosis not present

## 2019-04-19 DIAGNOSIS — M25511 Pain in right shoulder: Secondary | ICD-10-CM | POA: Diagnosis not present

## 2019-04-19 DIAGNOSIS — Z4889 Encounter for other specified surgical aftercare: Secondary | ICD-10-CM | POA: Diagnosis not present

## 2019-04-20 DIAGNOSIS — R600 Localized edema: Secondary | ICD-10-CM | POA: Diagnosis not present

## 2019-04-20 DIAGNOSIS — Z4889 Encounter for other specified surgical aftercare: Secondary | ICD-10-CM | POA: Diagnosis not present

## 2019-04-21 DIAGNOSIS — Z4889 Encounter for other specified surgical aftercare: Secondary | ICD-10-CM | POA: Diagnosis not present

## 2019-04-21 DIAGNOSIS — R600 Localized edema: Secondary | ICD-10-CM | POA: Diagnosis not present

## 2019-04-22 DIAGNOSIS — R600 Localized edema: Secondary | ICD-10-CM | POA: Diagnosis not present

## 2019-04-22 DIAGNOSIS — Z4889 Encounter for other specified surgical aftercare: Secondary | ICD-10-CM | POA: Diagnosis not present

## 2019-04-28 DIAGNOSIS — M25511 Pain in right shoulder: Secondary | ICD-10-CM | POA: Diagnosis not present

## 2019-04-30 DIAGNOSIS — M25511 Pain in right shoulder: Secondary | ICD-10-CM | POA: Diagnosis not present

## 2019-05-06 DIAGNOSIS — M25511 Pain in right shoulder: Secondary | ICD-10-CM | POA: Diagnosis not present

## 2019-05-13 DIAGNOSIS — M25511 Pain in right shoulder: Secondary | ICD-10-CM | POA: Diagnosis not present

## 2019-05-25 ENCOUNTER — Other Ambulatory Visit: Payer: Self-pay | Admitting: Family Medicine

## 2019-05-25 NOTE — Telephone Encounter (Signed)
Electronic refill request. Zolpidem Last office visit:   08/13/2018 CPE,  09/22/2018 Acute, Dr. Selena Batten Last Filled:    30 tablet 2 11/19/2018  Please advise.

## 2019-05-26 NOTE — Telephone Encounter (Signed)
Sent. Thanks.   

## 2019-09-15 ENCOUNTER — Encounter: Payer: Self-pay | Admitting: Family Medicine

## 2019-10-07 ENCOUNTER — Ambulatory Visit: Payer: BC Managed Care – PPO | Admitting: Family Medicine

## 2019-10-07 ENCOUNTER — Encounter: Payer: Self-pay | Admitting: Family Medicine

## 2019-10-07 ENCOUNTER — Other Ambulatory Visit: Payer: Self-pay

## 2019-10-07 VITALS — BP 132/84 | HR 86 | Temp 96.9°F | Ht 67.0 in | Wt 249.1 lb

## 2019-10-07 DIAGNOSIS — G4700 Insomnia, unspecified: Secondary | ICD-10-CM

## 2019-10-07 DIAGNOSIS — Z23 Encounter for immunization: Secondary | ICD-10-CM | POA: Diagnosis not present

## 2019-10-07 DIAGNOSIS — R0683 Snoring: Secondary | ICD-10-CM

## 2019-10-07 DIAGNOSIS — I10 Essential (primary) hypertension: Secondary | ICD-10-CM | POA: Diagnosis not present

## 2019-10-07 MED ORDER — VENLAFAXINE HCL ER 150 MG PO CP24: 150.0000 mg | ORAL_CAPSULE | Freq: Every day | ORAL | 3 refills | Status: DC

## 2019-10-07 MED ORDER — LISINOPRIL-HYDROCHLOROTHIAZIDE 20-25 MG PO TABS: 1.0000 | ORAL_TABLET | Freq: Every day | ORAL | 3 refills | Status: DC

## 2019-10-07 NOTE — Progress Notes (Signed)
This visit occurred during the SARS-CoV-2 public health emergency.  Safety protocols were in place, including screening questions prior to the visit, additional usage of staff PPE, and extensive cleaning of exam room while observing appropriate contact time as indicated for disinfecting solutions.  Labs d/w pt.    Hypertension:    Using medication without problems or lightheadedness: yes Chest pain with exertion:no Edema:no Short of breath:no Still on lisinopril/hctz.    She had covid vaccine.  Tetanus today.  D/w pt.    Discussed stressors over the last year.  Mother is in SNF.  Insomnia, taking ambien at night and that helps some, with effect for about 4 hours.  She does snore, noted by husband.  Still on effexor at baseline with relief.  Rare use of klonopin.    Meds, vitals, and allergies reviewed.   ROS: Per HPI unless specifically indicated in ROS section   GEN: nad, alert and oriented HEENT: ncat NECK: supple w/o LA CV: rrr.  PULM: ctab, no inc wob ABD: soft, +bs EXT: no edema SKIN: no acute rash

## 2019-10-07 NOTE — Patient Instructions (Signed)
We'll call about seeing pulmonary.  Your labs are okay.  Keep working on diet and exercise.  Don't change your meds for now.  Take care.  Glad to see you.

## 2019-10-11 NOTE — Assessment & Plan Note (Signed)
No change in meds at this point.  Continue work on diet and exercise.  Continue lisinopril hydrochlorothiazide.  Labs discussed with patient.

## 2019-10-11 NOTE — Assessment & Plan Note (Signed)
Continue Ambien.  Still okay for outpatient follow-up.  Mood discussed with patient.  No change in medications at this point.  Refer to pulmonary as she does snore and we need to know if she has sleep apnea.  Rationale discussed with patient.  She agrees. At least 30 minutes were devoted to patient care in this encounter (this can potentially include time spent reviewing the patient's file/history, interviewing and examining the patient, counseling/reviewing plan with patient, ordering referrals, ordering tests, reviewing relevant laboratory or x-ray data, and documenting the encounter).

## 2019-10-11 NOTE — Telephone Encounter (Signed)
Labs previously discussed with patient at office visit.  Please print labs off this message so I can mark them up for scanning.  Thanks.

## 2019-10-12 NOTE — Telephone Encounter (Signed)
Printed for scanning.

## 2019-10-18 ENCOUNTER — Other Ambulatory Visit: Payer: Self-pay

## 2019-10-18 ENCOUNTER — Ambulatory Visit (INDEPENDENT_AMBULATORY_CARE_PROVIDER_SITE_OTHER): Payer: BC Managed Care – PPO | Admitting: Internal Medicine

## 2019-10-18 ENCOUNTER — Encounter: Payer: Self-pay | Admitting: Internal Medicine

## 2019-10-18 VITALS — BP 130/70 | HR 87 | Temp 97.3°F | Ht 68.0 in | Wt 252.8 lb

## 2019-10-18 DIAGNOSIS — G4700 Insomnia, unspecified: Secondary | ICD-10-CM

## 2019-10-18 DIAGNOSIS — G4733 Obstructive sleep apnea (adult) (pediatric): Secondary | ICD-10-CM

## 2019-10-18 DIAGNOSIS — R0683 Snoring: Secondary | ICD-10-CM

## 2019-10-18 NOTE — Progress Notes (Signed)
8/233/21- 32 yoF never smoker for sleep evaluation courtesy of Dr Crawford Givens. Medical problem list includes HTN, GERD, Insomnia, Paresthesias, Urinary Incontinence, Hyperglycemia, Anxiety, Anemia, Covid infection Jan, 2021,  Body weight today 252 lbs Epworth score-8 Had 2 Phizer Covax She and husband( he is on CPAP) sleep separately, but he has heard her snore. Unrefreshing sleep. Ambien 5 mg helps only 4 hours. Stays tired through day. Denies other parasomnias. ENT+ tonsils out. Denies heart/ lung disease. Family hx of AFib, dementia, cardiac disease. Ambien at bedtime. Coffee- 1 cup, iced tead. Bedtime 9-10PM, latency 5 minutes, WASO 2 or 3, up 6:20AM.  Prior to Admission medications   Medication Sig Start Date End Date Taking? Authorizing Provider  clonazePAM (KLONOPIN) 0.5 MG tablet TAKE 1 TABLET TWICE DAILY AS NEEDED FOR ANXIETY. 03/30/19  Yes Joaquim Nam, MD  esomeprazole (NEXIUM) 20 MG capsule Take 2 capsules (40 mg total) by mouth daily at 12 noon. 08/13/18  Yes Joaquim Nam, MD  ibuprofen (ADVIL,MOTRIN) 200 MG tablet Take 200 mg by mouth every 6 (six) hours as needed.   Yes [provider]  lisinopril-hydrochlorothiazide (ZESTORETIC) 20-25 MG tablet Take 1 tablet by mouth daily. 10/07/19  Yes Joaquim Nam, MD  naproxen (NAPROSYN) 500 MG tablet Take 500 mg by mouth 2 (two) times daily with a meal. As needed   Yes [provider]  venlafaxine XR (EFFEXOR-XR) 150 MG 24 hr capsule Take 1 capsule (150 mg total) by mouth daily with breakfast. 10/07/19  Yes Joaquim Nam, MD  zolpidem (AMBIEN) 10 MG tablet TAKE 1/2 TABLET AT BEDTIME. 05/26/19  Yes Joaquim Nam, MD   Past Medical History:  Diagnosis Date  . Allergy   . Anemia   . Anxiety   . Arthritis   . GERD (gastroesophageal reflux disease)   . H/O hiatal hernia   . HTN (hypertension)   . Insomnia   . Urinary incontinence   . Uterine polyp    s/p resection   Past Surgical History:  Procedure  Laterality Date  . CHOLECYSTECTOMY    . COLONOSCOPY  06/03/2017  . TONSILLECTOMY    . URETHRAL SLING    . VAGINAL DELIVERY     Family History  Problem Relation Age of Onset  . Diabetes Mother   . Memory loss Mother   . Colon polyps Mother   . Diabetes Father   . Kidney disease Father   . Colon polyps Father   . Diverticulitis Father        also small bowel blockages with colectomy x 2   . Breast cancer Paternal Grandmother   . Colon polyps Sister   . Esophageal cancer Maternal Aunt   . Colon cancer Neg Hx   . Rectal cancer Neg Hx   . Stomach cancer Neg Hx    Social History   Socioeconomic History  . Marital status: Married    Spouse name: Not on file  . Number of children: Not on file  . Years of education: Not on file  . Highest education level: Not on file  Occupational History  . Not on file  Tobacco Use  . Smoking status: Never Smoker  . Smokeless tobacco: Never Used  Substance and Sexual Activity  . Alcohol use: Yes    Comment: socially  . Drug use: No  . Sexual activity: Not on file  Other Topics Concern  . Not on file  Social History Narrative   Married 1984   Insurance account  Production designer, theatre/television/film   Social Determinants of Health   Financial Resource Strain:   . Difficulty of Paying Living Expenses: Not on file  Food Insecurity:   . Worried About Programme researcher, broadcasting/film/video in the Last Year: Not on file  . Ran Out of Food in the Last Year: Not on file  Transportation Needs:   . Lack of Transportation (Medical): Not on file  . Lack of Transportation (Non-Medical): Not on file  Physical Activity:   . Days of Exercise per Week: Not on file  . Minutes of Exercise per Session: Not on file  Stress:   . Feeling of Stress : Not on file  Social Connections:   . Frequency of Communication with Friends and Family: Not on file  . Frequency of Social Gatherings with Friends and Family: Not on file  . Attends Religious Services: Not on file  . Active Member of Clubs or  Organizations: Not on file  . Attends Banker Meetings: Not on file  . Marital Status: Not on file  Intimate Partner Violence:   . Fear of Current or Ex-Partner: Not on file  . Emotionally Abused: Not on file  . Physically Abused: Not on file  . Sexually Abused: Not on file   ROS-see HPI   + = positive Constitutional:    weight loss, night sweats, fevers, chills, +fatigue, lassitude. HEENT:    +headaches, difficulty swallowing, tooth/dental problems, sore throat,       sneezing, itching, ear ache,+ nasal congestion, post nasal drip, snoring CV:    chest pain, orthopnea, PND, swelling in lower extremities, anasarca,                                  dizziness, palpitations Resp:   shortness of breath with exertion or at rest.                +productive cough,   non-productive cough, coughing up of blood.              change in color of mucus.  wheezing.   Skin:    rash or lesions. GI:  + heartburn, +indigestion, abdominal pain, nausea, vomiting, diarrhea,                 change in bowel habits, loss of appetite GU: dysuria, change in color of urine, no urgency or frequency.   flank pain. MS:   +joint pain, stiffness, decreased range of motion, back pain. Neuro-     nothing unusual Psych:  change in mood or affect.  depression or +anxiety.   memory loss.  OBJ- Physical Exam General- Alert, Oriented, Affect-appropriate, Distress- none acute, + obese Skin- rash-none, lesions- none, excoriation- none Lymphadenopathy- none Head- atraumatic            Eyes- Gross vision intact, PERRLA, conjunctivae and secretions clear            Ears- Hearing, canals-normal            Nose- Clear, no-Septal dev, mucus, polyps, erosion, perforation             Throat- Mallampati II-III , mucosa clear , drainage- none, tonsils absent, +teeth Neck- flexible , trachea midline, no stridor , thyroid nl, carotid no bruit Chest - symmetrical excursion , unlabored           Heart/CV- RRR , no murmur  , no gallop  , no rub,  nl s1 s2                           - JVD- none , edema- none, stasis changes- none, varices- none           Lung- clear to P&A, wheeze- none, cough- none , dullness-none, rub- none           Chest wall-  Abd-  Br/ Gen/ Rectal- Not done, not indicated Extrem- cyanosis- none, clubbing, none, atrophy- none, strength- nl Neuro- grossly intact to observation

## 2019-10-18 NOTE — Patient Instructions (Addendum)
Order- schedule home sleep test   Dx OSA  Please call us about 2 weeks after your sleep test to see if results and recommendations are ready yet. If appropriate, we may be able to start treatment before we see you next. 

## 2019-10-19 DIAGNOSIS — G4733 Obstructive sleep apnea (adult) (pediatric): Secondary | ICD-10-CM | POA: Insufficient documentation

## 2019-10-19 NOTE — Assessment & Plan Note (Signed)
Probable OSA. Appropriate education and discussion done. Plan- sleep study the ? CPAP

## 2019-10-19 NOTE — Assessment & Plan Note (Signed)
Routinely using ambien 5 mg, which helps sustain sleep for about 4 hours. Discussed potential interference if there is OSA.

## 2019-11-17 ENCOUNTER — Ambulatory Visit: Payer: BC Managed Care – PPO

## 2019-11-17 ENCOUNTER — Other Ambulatory Visit: Payer: Self-pay

## 2019-11-17 DIAGNOSIS — G4733 Obstructive sleep apnea (adult) (pediatric): Secondary | ICD-10-CM

## 2019-11-19 ENCOUNTER — Other Ambulatory Visit: Payer: Self-pay | Admitting: Family Medicine

## 2019-11-19 NOTE — Telephone Encounter (Signed)
Last filled 09-22-19 #30 Last OV 10-07-19 No Future OV [facility]

## 2019-11-21 NOTE — Telephone Encounter (Signed)
Sent. Thanks.   

## 2019-12-01 ENCOUNTER — Telehealth: Payer: Self-pay | Admitting: Pulmonary Disease

## 2019-12-01 DIAGNOSIS — G4734 Idiopathic sleep related nonobstructive alveolar hypoventilation: Secondary | ICD-10-CM

## 2019-12-01 DIAGNOSIS — G4733 Obstructive sleep apnea (adult) (pediatric): Secondary | ICD-10-CM | POA: Diagnosis not present

## 2019-12-01 NOTE — Telephone Encounter (Signed)
Pt was left vm to return call back to receive sleep study results

## 2019-12-01 NOTE — Telephone Encounter (Signed)
Call patient  Sleep study result  Date of study: 11/17/2019  Impression: Moderate obstructive sleep apnea Severe oxygen desaturations Hypoxia is not entirely related to sleep disordered breathing and should be further evaluated  Recommendation: Recommend in lab titration study for moderately severe obstructive sleep apnea and severe oxygen desaturations.  Will likely require oxygen supplementation which can be optimally ascertained with in lab testing  Follow-up for further evaluation of nocturnal hypoxia which can be seen in lung disease and possible pulmonary hypertension  Follow-up as previously scheduled   PS: Dr Roxy Cedar patient Judeth Cornfield will call patient to apprise her of result

## 2019-12-01 NOTE — Telephone Encounter (Signed)
To clarify Dr Trena Platt note - her sleep study showed moderately severe obstructive sleep apnea, averaging 28 apneas/ hour.  It also showed that her blood oxygen level is staying too low when she sleeps. To manage this, insurance rules require that we do an overnight sleep study at the sleep center to fit her with CPAP and see if that corrects the oxygen problem.  Please order-  schedule CPAP titration sleep study at sleep center     dxx OSA, nocturnal hypoxemia

## 2019-12-01 NOTE — Telephone Encounter (Signed)
LMTCB

## 2019-12-02 DIAGNOSIS — L570 Actinic keratosis: Secondary | ICD-10-CM | POA: Diagnosis not present

## 2019-12-02 DIAGNOSIS — L821 Other seborrheic keratosis: Secondary | ICD-10-CM | POA: Diagnosis not present

## 2019-12-02 DIAGNOSIS — D225 Melanocytic nevi of trunk: Secondary | ICD-10-CM | POA: Diagnosis not present

## 2019-12-02 DIAGNOSIS — Z23 Encounter for immunization: Secondary | ICD-10-CM | POA: Diagnosis not present

## 2019-12-02 DIAGNOSIS — L578 Other skin changes due to chronic exposure to nonionizing radiation: Secondary | ICD-10-CM | POA: Diagnosis not present

## 2019-12-02 NOTE — Telephone Encounter (Signed)
Pt was called pt was given sleep study results and recommendations per dr Wynona Neat has agreed to cpap titration study. Order for cpap titration placed in chart

## 2019-12-10 ENCOUNTER — Encounter: Payer: Self-pay | Admitting: Family Medicine

## 2019-12-10 LAB — LAB REPORT - SCANNED
ALT: 31 — AB (ref 3–30)
AST: 32
Cholesterol: 200
Creatinine, Ser: 1.02
Glucose: 100
HDL: 62
Hemoglobin: 15.2
LDL (calc): 120
Platelets: 246
TSH: 3.12
Triglycerides: 99 (ref 40–160)
WBC: 4.9

## 2020-01-03 ENCOUNTER — Other Ambulatory Visit: Payer: Self-pay | Admitting: Family Medicine

## 2020-01-03 NOTE — Telephone Encounter (Signed)
Received refill request for:  Clonazepam 0.5 mg LR 03/30/19, #10, 1 rf LOV 10/07/19  FOV  None scheduled  Please review and advise.   Thanks.  Dm/cma

## 2020-01-04 NOTE — Telephone Encounter (Signed)
Sent. Thanks.   

## 2020-01-09 ENCOUNTER — Ambulatory Visit (HOSPITAL_BASED_OUTPATIENT_CLINIC_OR_DEPARTMENT_OTHER): Payer: BC Managed Care – PPO | Attending: Pulmonary Disease | Admitting: Internal Medicine

## 2020-01-09 ENCOUNTER — Other Ambulatory Visit: Payer: Self-pay

## 2020-01-09 DIAGNOSIS — G4734 Idiopathic sleep related nonobstructive alveolar hypoventilation: Secondary | ICD-10-CM

## 2020-01-11 DIAGNOSIS — G4733 Obstructive sleep apnea (adult) (pediatric): Secondary | ICD-10-CM | POA: Diagnosis not present

## 2020-01-23 ENCOUNTER — Telehealth: Payer: Self-pay | Admitting: Pulmonary Disease

## 2020-01-23 NOTE — Telephone Encounter (Signed)
Call patient  Sleep study result  Date of study: 01/09/2020  Impression: Moderate obstructive sleep apnea with moderate oxygen desaturations Optimal pressures achieved with CPAP therapy  Recommendation: DME referral  Recommend CPAP therapy for moderate obstructive sleep apnea  Trial of CPAP therapy on 10 cm H2O with a Medium size Resmed Nasal Mask Airfit N20 mask and heated humidification.  Encourage weight loss measures  Follow-up in the office 4 to 6 weeks following initiation of treatment

## 2020-01-23 NOTE — Procedures (Signed)
POLYSOMNOGRAPHY Last, First: Dawn [name], Dawn [name] MRN: [id number] Gender: Female Age (years): 59 Weight (lbs): 252 DOB: October 01, 1960 BMI: 39 Primary Care: No PCP Epworth Score: <br> Referring: Tomma Lightning MD Technician: Cherylann Parr Interpreting: Tomma Lightning MD Study Type: CPAP Ordered Study Type: CPAP Study date: 01/09/2020 Location: [place]      CLINICAL INFORMATION      Dawn [name] is a 59 year old Female and was referred to the sleep center for evaluation of N/A. Indications include Snoring (786.09), Witnessed Apneas, Witnesses Apnea / Gasping During Sleep.     MEDICATIONS      Patient self administered medications include: AMBIEN. Medications administered during study include No sleep medicine administered.     SLEEP STUDY TECHNIQUE      The patient underwent an attended overnight polysomnography titration to assess the effects of CPAP therapy. The following variables were monitored: EEG(C4-A1, C3-A2, O1-A2, O2-A1), EOG, submental and leg EMG, ECG, oxyhemoglobin saturation by pulse oximetry, thoracic and abdominal respiratory effort belts, nasal/oral airflow by pressure sensor, body position sensor and snoring sensor. CPAP pressure was titrated to eliminate apneas, hypopneas and oxygen desaturation. Hypopneas were scored per AASM definition IB (4% desaturation)     TECHNICIAN COMMENTS     Comments added by Technician: Patient had difficulty initiating sleep. RESMED AIR-FIT N20 NASAL MASK WAS USED FOR THIS STUDY     Comments added by Scorer: N/A     SLEEP ARCHITECTURE     The study was initiated at 10:14:40 PM and terminated at 5:08:58 AM. Total recorded time was 414.3 minutes. EEG confirmed total sleep time was 376 minutes yielding a sleep efficiency of 90.8%%. Sleep onset after lights out was 4.4 minutes with a REM latency of 283.5 minutes. The patient spent 2.9%% of the night in stage N1 sleep, 94.8%% in stage N2 sleep, 0.0%% in stage N3 and 2.3% in REM. The Arousal  Index was 8.3/hour.     RESPIRATORY PARAMETERS      The overall AHI was 1.4 per hour, and the RDI was 1.4 events/hour with a central apnea index of 0.0per hour. The most appropriate setting of CPAP was 10 cm H2O. At this setting, the sleep efficiency was 90% and the patient was supine for 100%. The AHI was 0.0 events per hour, and the RDI was 0.0 events/hour (with 0.0 central events) and the arousal index was 9.3 per hour.The oxygen nadir was 89.0% during sleep.       The cumulative time under 88% oxygen saturation was 5.5 minutes     LEG MOVEMENT DATA     The total leg movements were 33 with a resulting leg movement index of 5.3/hr. Associated arousal with leg movement index was 0.3/hr.     CARDIAC DATA     The underlying cardiac rhythm was most consistent with sinus rhythm. Mean heart rate during sleep was 63.6 bpm. Additional rhythm abnormalities include None.       IMPRESSIONS         - EKG showed no cardiac abnormalities.         - No snoring was audible during this study.         - Moderate Oxygen Desaturation         - ModerateObstructive Sleep apnea(OSA) diagnosed on home sleep study, Optimal pressure attained.         - No Significant Central Sleep Apnea (CSA)         - No Significant Upper Airway Resistance Syndrome(UARS).         -  No significant periodic leg movements(PLMs) during sleep. However, no significant associated arousals.         - Normal sleep efficiency, short primary sleep latency, long REM sleep latency and no slow wave latency.       DIAGNOSIS         - Obstructive Sleep Apnea (G47.33)       RECOMMENDATIONS         - Trial of CPAP therapy on 10 cm H2O with a Medium size Resmed Nasal Mask Airfit N20 mask and heated humidification.         - Avoid alcohol, sedatives and other CNS depressants that may worsen sleep apnea and disrupt normal sleep architecture.         - Sleep hygiene should be reviewed to assess factors that may improve sleep quality.         -  Weight management and regular exercise should be initiated or continued.         - Return to Sleep Center for re-evaluation after 4 weeks of therapy   [Electronically signed] 01/23/2020 10:07 AM  Virl Diamond MD NPI: [id number]

## 2020-01-24 NOTE — Progress Notes (Signed)
HPI F never smoker followed for OSA, complicated by HTN, GERD, Insomnia, Paresthesias, Urinary Incontinence, Hyperglycemia, Anxiety, Anemia, Covid infection Jan, 2021,  HST 11/17/19- AHI 28/ hr, desaturation to 67% with mean 88%. CPAP titration 01/09/20- 10 cwp with mean O2 sat 91%.  ==================================================  8/233/21- 59 yoF never smoker for sleep evaluation courtesy of Dr Crawford Givens. Medical problem list includes HTN, GERD, Insomnia, Paresthesias, Urinary Incontinence, Hyperglycemia, Anxiety, Anemia, Covid infection Jan, 2021,  Body weight today 252 lbs Epworth score-8 Had 2 Phizer Covax She and husband( he is on CPAP) sleep separately, but he has heard her snore. Unrefreshing sleep. Ambien 5 mg helps only 4 hours. Stays tired through day. Denies other parasomnias. ENT+ tonsils out. Denies heart/ lung disease. Family hx of AFib, dementia, cardiac disease. Ambien at bedtime. Coffee- 1 cup, iced tead. Bedtime 9-10PM, latency 5 minutes, WASO 2 or 3, up 6:20AM.  01/25/20- 59 yoF never smoker for sleep evaluation courtesy of Dr Crawford Givens. Medical problem list includes HTN, GERD, Insomnia, Paresthesias, Urinary Incontinence, Hyperglycemia, Anxiety, Anemia, Covid infection Jan, 2021,  HST 11/17/19``- AHI 28/ hr, desaturation to 67% with mean 88%. CPAP titration 01/09/20- 10 cwp with mean O2 sat 91%.  Clonazepam 0.5 bid  Prn anxiety, ambien 10 Body weight today- 228 lbs Covid vax- 2 Phizer Flu vax- had -----Patient feels good overall, does not currently sleep with CPAP, sleeping ok due to stress with mother. Reviewed sleep study and OSA. No lung problems known. Hypoxemia in sleep may be from OHS/ hypoventilation.  Husband uses CPAP so she is familiar.   ROS-see HPI   + = positive Constitutional:    weight loss, night sweats, fevers, chills, +fatigue, lassitude. HEENT:    +headaches, difficulty swallowing, tooth/dental problems, sore throat,       sneezing,  itching, ear ache,+ nasal congestion, post nasal drip, snoring CV:    chest pain, orthopnea, PND, swelling in lower extremities, anasarca,                                  dizziness, palpitations Resp:   shortness of breath with exertion or at rest.                +productive cough,   non-productive cough, coughing up of blood.              change in color of mucus.  wheezing.   Skin:    rash or lesions. GI:  + heartburn, +indigestion, abdominal pain, nausea, vomiting, diarrhea,                 change in bowel habits, loss of appetite GU: dysuria, change in color of urine, no urgency or frequency.   flank pain. MS:   +joint pain, stiffness, decreased range of motion, back pain. Neuro-     nothing unusual Psych:  change in mood or affect.  depression or +anxiety.   memory loss.  OBJ- Physical Exam General- Alert, Oriented, Affect-appropriate, Distress- none acute, + obese Skin- rash-none, lesions- none, excoriation- none Lymphadenopathy- none Head- atraumatic            Eyes- Gross vision intact, PERRLA, conjunctivae and secretions clear            Ears- Hearing, canals-normal            Nose- Clear, no-Septal dev, mucus, polyps, erosion, perforation  Throat- Mallampati II-III , mucosa clear , drainage- none, tonsils absent, +teeth Neck- flexible , trachea midline, no stridor , thyroid nl, carotid no bruit Chest - symmetrical excursion , unlabored           Heart/CV- RRR , no murmur , no gallop  , no rub, nl s1 s2                           - JVD- none , edema- none, stasis changes- none, varices- none           Lung- clear to P&A, wheeze- none, cough- none , dullness-none, rub- none           Chest wall-  Abd-  Br/ Gen/ Rectal- Not done, not indicated Extrem- cyanosis- none, clubbing, none, atrophy- none, strength- nl Neuro- grossly intact to observation

## 2020-01-25 ENCOUNTER — Encounter: Payer: Self-pay | Admitting: Internal Medicine

## 2020-01-25 ENCOUNTER — Ambulatory Visit (INDEPENDENT_AMBULATORY_CARE_PROVIDER_SITE_OTHER): Payer: BC Managed Care – PPO | Admitting: Internal Medicine

## 2020-01-25 ENCOUNTER — Other Ambulatory Visit: Payer: Self-pay

## 2020-01-25 VITALS — BP 122/80 | HR 80 | Temp 97.2°F | Ht 67.52 in | Wt 228.4 lb

## 2020-01-25 DIAGNOSIS — G4700 Insomnia, unspecified: Secondary | ICD-10-CM | POA: Diagnosis not present

## 2020-01-25 DIAGNOSIS — R0683 Snoring: Secondary | ICD-10-CM

## 2020-01-25 DIAGNOSIS — G4733 Obstructive sleep apnea (adult) (pediatric): Secondary | ICD-10-CM

## 2020-01-25 DIAGNOSIS — G4734 Idiopathic sleep related nonobstructive alveolar hypoventilation: Secondary | ICD-10-CM | POA: Diagnosis not present

## 2020-01-25 NOTE — Patient Instructions (Signed)
Order- new DME( husband uses [employer]) new CPAP auto 5-15, mask of choice, humidifier, supplies, AirView/ card  Please call if we can help

## 2020-01-25 NOTE — Assessment & Plan Note (Signed)
CPAP is best initial choice for her. Discussed comfort and purpose. Plan- start CPAP auto 5-15.

## 2020-01-25 NOTE — Telephone Encounter (Signed)
Lmtcb for pt.  

## 2020-01-25 NOTE — Assessment & Plan Note (Signed)
Low baseline O2 sat on HST, fairly well corrected by CPAP. Probably hypoventilation, but we will check PFT. Consider if CXR, CBC etc needed later.  Plan- PFT

## 2020-01-26 ENCOUNTER — Encounter: Payer: Self-pay | Admitting: *Deleted

## 2020-01-26 ENCOUNTER — Telehealth: Payer: Self-pay | Admitting: Pulmonary Disease

## 2020-01-26 DIAGNOSIS — Z1231 Encounter for screening mammogram for malignant neoplasm of breast: Secondary | ICD-10-CM | POA: Diagnosis not present

## 2020-01-26 DIAGNOSIS — Z6835 Body mass index (BMI) 35.0-35.9, adult: Secondary | ICD-10-CM | POA: Diagnosis not present

## 2020-01-26 DIAGNOSIS — Z01419 Encounter for gynecological examination (general) (routine) without abnormal findings: Secondary | ICD-10-CM | POA: Diagnosis not present

## 2020-01-26 NOTE — Telephone Encounter (Signed)
Noted  

## 2020-01-26 NOTE — Telephone Encounter (Signed)
LMTCB again and will mail letter per protocol

## 2020-03-07 DIAGNOSIS — J0100 Acute maxillary sinusitis, unspecified: Secondary | ICD-10-CM | POA: Diagnosis not present

## 2020-03-14 DIAGNOSIS — R110 Nausea: Secondary | ICD-10-CM | POA: Diagnosis not present

## 2020-03-14 DIAGNOSIS — R61 Generalized hyperhidrosis: Secondary | ICD-10-CM | POA: Diagnosis not present

## 2020-03-14 DIAGNOSIS — R55 Syncope and collapse: Secondary | ICD-10-CM | POA: Diagnosis not present

## 2020-03-29 DIAGNOSIS — Z20822 Contact with and (suspected) exposure to covid-19: Secondary | ICD-10-CM | POA: Diagnosis not present

## 2020-04-07 DIAGNOSIS — G4733 Obstructive sleep apnea (adult) (pediatric): Secondary | ICD-10-CM | POA: Diagnosis not present

## 2020-04-11 ENCOUNTER — Telehealth: Payer: Self-pay

## 2020-04-11 NOTE — Telephone Encounter (Signed)
ATC patient because we received notification from [employer] that she was set up with CPAP on 04/07/20. Patient needs to have an OV with Dr. Maple Hudson between 05/08/20- 07/06/20 per insurance for compliance. Left detailed message on voicemail. Patient is scheduled for PFT on 2/28 and OV. She can keep PFT if she wants for that time or reschedule both.

## 2020-04-19 NOTE — Telephone Encounter (Signed)
Patient is rescheduled for PFT and OV  Next Appt With Pulmonology ([facility] PFT room) 06/01/2020 at 1:00 PM with OV at 2pm

## 2020-04-22 ENCOUNTER — Other Ambulatory Visit (HOSPITAL_COMMUNITY): Payer: BC Managed Care – PPO

## 2020-04-24 ENCOUNTER — Ambulatory Visit: Payer: BC Managed Care – PPO | Admitting: Internal Medicine

## 2020-04-27 NOTE — Telephone Encounter (Signed)
Try otc nasal saline gel (AYR, NeilMed, store brands) - a little in each nosstril at bedtime or anytime needed

## 2020-04-27 NOTE — Telephone Encounter (Signed)
CY please advise what the pt may use to help with nasal congestion with the cpap use.  thanks

## 2020-05-05 DIAGNOSIS — G4733 Obstructive sleep apnea (adult) (pediatric): Secondary | ICD-10-CM | POA: Diagnosis not present

## 2020-05-18 ENCOUNTER — Other Ambulatory Visit: Payer: Self-pay

## 2020-05-18 NOTE — Telephone Encounter (Signed)
Dawn [name] with [facility] left v/m requesting refill for ambien 10 mg; Name of Medication: Ambien 10 mg Name of Pharmacy: [facility] pharmacy Last [place] or Written Date and Quantity:# 30 x 2 on 11/21/19  Last Office Visit and Type: 10/07/19 for 6 mth FU Next Office Visit and Type: none scheduled

## 2020-05-19 MED ORDER — ZOLPIDEM TARTRATE 10 MG PO TABS
5.0000 mg | ORAL_TABLET | Freq: Every day | ORAL | 2 refills | Status: DC
Start: 2020-05-19 — End: 2020-11-22

## 2020-05-19 NOTE — Telephone Encounter (Signed)
Sent. Thanks.   

## 2020-05-29 ENCOUNTER — Other Ambulatory Visit (HOSPITAL_COMMUNITY)
Admission: RE | Admit: 2020-05-29 | Discharge: 2020-05-29 | Disposition: A | Discharge status: Home / Self Care | Payer: BC Managed Care – PPO | Source: Ambulatory Visit | Attending: Internal Medicine | Admitting: Internal Medicine

## 2020-05-29 DIAGNOSIS — Z20822 Contact with and (suspected) exposure to covid-19: Secondary | ICD-10-CM | POA: Diagnosis not present

## 2020-05-29 DIAGNOSIS — Z01812 Encounter for preprocedural laboratory examination: Secondary | ICD-10-CM | POA: Diagnosis not present

## 2020-05-29 LAB — SARS CORONAVIRUS 2 (TAT 6-24 HRS): SARS Coronavirus 2: NEGATIVE

## 2020-05-31 NOTE — Progress Notes (Signed)
HPI F never smoker followed for OSA, complicated by HTN, GERD, Insomnia, Paresthesias, Urinary Incontinence, Hyperglycemia, Anxiety, Anemia, Covid infection Jan, 2021,  HST 11/17/19- AHI 28/ hr, desaturation to 67% with mean 88%. CPAP titration 01/09/20- 10 cwp with mean O2 sat 91%.  PFT 06/01/20- WNL ==================================================   01/25/20- 59 yoF never smoker for sleep evaluation courtesy of Dr Crawford Givens. Medical problem list includes HTN, GERD, Insomnia, Paresthesias, Urinary Incontinence, Hyperglycemia, Anxiety, Anemia, Covid infection Jan, 2021,  HST 11/17/19``- AHI 28/ hr, desaturation to 67% with mean 88%. CPAP titration 01/09/20- 10 cwp with mean O2 sat 91%.  Clonazepam 0.5 bid  Prn anxiety, ambien 10 Body weight today- 228 lbs Covid vax- 2 Phizer Flu vax- had -----Patient feels good overall, does not currently sleep with CPAP, sleeping ok due to stress with mother. Reviewed sleep study and OSA. No lung problems known. Hypoxemia in sleep may be from OHS/ hypoventilation.  Husband uses CPAP so she is familiar.   06/01/20-  32 yoF never smoker followed for OSA, complicated by .HTN, GERD, Insomnia, Paresthesias, Urinary Incontinence, Hyperglycemia, Anxiety, Anemia, Covid infection Jan, 2021,  -clonazepam 0.5, Ambien 5 mg CPAP auto 5-15/ [facility]- ordered 01/25/20   AirSense 11 AutoSet machine Download- compliance 40%, AHI 2.4/ hr Body weight today- 220 lbs Covid vax- 3Phizer Flu vax-had -----Having connection problems with WiFi. Not wearing it all night Discussed CPAP goals. Settled on FullFace mask. Husband supportive, but in separate bedroom. Some cough as she gets over a cold.  PFT 06/01/20- WNL   ROS-see HPI   + = positive Constitutional:    weight loss, night sweats, fevers, chills, +fatigue, lassitude. HEENT:    +headaches, difficulty swallowing, tooth/dental problems, sore throat,       sneezing, itching, ear ache,+ nasal congestion, post nasal drip,  snoring CV:    chest pain, orthopnea, PND, swelling in lower extremities, anasarca,                                   dizziness, palpitations Resp:   shortness of breath with exertion or at rest.                +productive cough,  + non-productive cough, coughing up of blood.              change in color of mucus.  wheezing.   Skin:    rash or lesions. GI:  + heartburn, +indigestion, abdominal pain, nausea, vomiting, diarrhea,                 change in bowel habits, loss of appetite GU: dysuria, change in color of urine, no urgency or frequency.   flank pain. MS:   +joint pain, stiffness, decreased range of motion, back pain. Neuro-     nothing unusual Psych:  change in mood or affect.  depression or +anxiety.   memory loss.  OBJ- Physical Exam General- Alert, Oriented, Affect-appropriate, Distress- none acute, + obese Skin- rash-none, lesions- none, excoriation- none Lymphadenopathy- none Head- atraumatic            Eyes- Gross vision intact, PERRLA, conjunctivae and secretions clear            Ears- Hearing, canals-normal            Nose- Clear, no-Septal dev, mucus, polyps, erosion, perforation             Throat- Mallampati II-III , mucosa clear ,  drainage- none, tonsils absent, +teeth Neck- flexible , trachea midline, no stridor , thyroid nl, carotid no bruit Chest - symmetrical excursion , unlabored           Heart/CV- RRR , no murmur , no gallop  , no rub, nl s1 s2                           - JVD- none , edema- none, stasis changes- none, varices- none           Lung- clear to P&A, wheeze- none, cough- none , dullness-none, rub- none           Chest wall-  Abd-  Br/ Gen/ Rectal- Not done, not indicated Extrem- cyanosis- none, clubbing, none, atrophy- none, strength- nl Neuro- grossly intact to observation

## 2020-06-01 ENCOUNTER — Encounter: Payer: Self-pay | Admitting: Internal Medicine

## 2020-06-01 ENCOUNTER — Ambulatory Visit: Payer: BC Managed Care – PPO | Admitting: Internal Medicine

## 2020-06-01 ENCOUNTER — Ambulatory Visit (INDEPENDENT_AMBULATORY_CARE_PROVIDER_SITE_OTHER): Payer: BC Managed Care – PPO | Admitting: Internal Medicine

## 2020-06-01 ENCOUNTER — Other Ambulatory Visit: Payer: Self-pay

## 2020-06-01 VITALS — BP 130/82 | HR 82 | Temp 97.4°F | Ht 67.0 in | Wt 220.0 lb

## 2020-06-01 DIAGNOSIS — G4734 Idiopathic sleep related nonobstructive alveolar hypoventilation: Secondary | ICD-10-CM | POA: Diagnosis not present

## 2020-06-01 DIAGNOSIS — G4733 Obstructive sleep apnea (adult) (pediatric): Secondary | ICD-10-CM

## 2020-06-01 LAB — PULMONARY FUNCTION TEST
DL/VA % pred: 111 %
DL/VA: 4.65 ml/min/mmHg/L
DLCO cor % pred: 116 %
DLCO cor: 25.3 ml/min/mmHg
DLCO unc % pred: 116 %
DLCO unc: 25.3 ml/min/mmHg
FEF 25-75 Post: 3.93 L/sec
FEF 25-75 Pre: 4.12 L/sec
FEF2575-%Change-Post: -4 %
FEF2575-%Pred-Post: 156 %
FEF2575-%Pred-Pre: 164 %
FEV1-%Change-Post: 0 %
FEV1-%Pred-Post: 116 %
FEV1-%Pred-Pre: 115 %
FEV1-Post: 3.22 L
FEV1-Pre: 3.2 L
FEV1FVC-%Change-Post: 2 %
FEV1FVC-%Pred-Pre: 107 %
FEV6-%Change-Post: -1 %
FEV6-%Pred-Post: 107 %
FEV6-%Pred-Pre: 108 %
FEV6-Post: 3.72 L
FEV6-Pre: 3.76 L
FEV6FVC-%Change-Post: 0 %
FEV6FVC-%Pred-Post: 103 %
FEV6FVC-%Pred-Pre: 103 %
FVC-%Change-Post: -1 %
FVC-%Pred-Post: 103 %
FVC-%Pred-Pre: 105 %
FVC-Post: 3.72 L
FVC-Pre: 3.79 L
Post FEV1/FVC ratio: 87 %
Post FEV6/FVC ratio: 100 %
Pre FEV1/FVC ratio: 85 %
Pre FEV6/FVC Ratio: 100 %
RV % pred: 117 %
RV: 2.42 L
TLC % pred: 116 %
TLC: 6.25 L

## 2020-06-01 NOTE — Patient Instructions (Signed)
We can continue CPAP auto 5-15, mask of choice, humidifier, supplies,AirView/ card  Our goal is to continue CPAP at least 4 hours/ night and eventually  All night every night.  Please call if we can help

## 2020-06-01 NOTE — Progress Notes (Signed)
PFT done today. 

## 2020-06-05 DIAGNOSIS — G4733 Obstructive sleep apnea (adult) (pediatric): Secondary | ICD-10-CM | POA: Diagnosis not present

## 2020-06-07 DIAGNOSIS — M7502 Adhesive capsulitis of left shoulder: Secondary | ICD-10-CM | POA: Diagnosis not present

## 2020-06-08 ENCOUNTER — Encounter: Payer: Self-pay | Admitting: Family Medicine

## 2020-06-25 NOTE — Assessment & Plan Note (Signed)
Does benefit from CPAP Plan- emphasis on consistent use as reviewed. Continue auto 5-15

## 2020-06-25 NOTE — Assessment & Plan Note (Signed)
PFT 06/01/20- WNL Mainly hypoventilation, ilikely to improve with CPAP and weight loss as discussed.

## 2020-07-05 DIAGNOSIS — G4733 Obstructive sleep apnea (adult) (pediatric): Secondary | ICD-10-CM | POA: Diagnosis not present

## 2020-07-07 ENCOUNTER — Other Ambulatory Visit: Payer: Self-pay

## 2020-07-07 DIAGNOSIS — D485 Neoplasm of uncertain behavior of skin: Secondary | ICD-10-CM | POA: Diagnosis not present

## 2020-07-07 DIAGNOSIS — D2361 Other benign neoplasm of skin of right upper limb, including shoulder: Secondary | ICD-10-CM | POA: Diagnosis not present

## 2020-07-07 DIAGNOSIS — D2261 Melanocytic nevi of right upper limb, including shoulder: Secondary | ICD-10-CM | POA: Diagnosis not present

## 2020-07-07 DIAGNOSIS — L814 Other melanin hyperpigmentation: Secondary | ICD-10-CM | POA: Diagnosis not present

## 2020-07-07 DIAGNOSIS — L821 Other seborrheic keratosis: Secondary | ICD-10-CM | POA: Diagnosis not present

## 2020-07-07 DIAGNOSIS — L281 Prurigo nodularis: Secondary | ICD-10-CM | POA: Diagnosis not present

## 2020-07-07 NOTE — Telephone Encounter (Signed)
Pt left v/m requesting status of refill request from [facility] on 07/04/20 for clonazepam 0.5mg . pt request cb when refilled. Do not see previous refill request for clonazepam 0.5 mg prior to todays request.   Name of Medication: clonazepam 0.5 mg Name of Pharmacy: [facility] Last Fill or Written Date and Quantity: # 10 x 1 on 01/04/20 Last Office Visit and Type: 10/07/2019 for 6 mth ck up Next Office Visit and Type: none scheduled.

## 2020-07-09 MED ORDER — CLONAZEPAM 0.5 MG PO TABS: ORAL_TABLET | ORAL | 1 refills | Status: DC

## 2020-07-09 NOTE — Telephone Encounter (Signed)
Sent. Thanks.   

## 2020-07-19 DIAGNOSIS — M7542 Impingement syndrome of left shoulder: Secondary | ICD-10-CM | POA: Diagnosis not present

## 2020-07-19 DIAGNOSIS — M7502 Adhesive capsulitis of left shoulder: Secondary | ICD-10-CM | POA: Diagnosis not present

## 2020-07-26 DIAGNOSIS — N760 Acute vaginitis: Secondary | ICD-10-CM | POA: Diagnosis not present

## 2020-08-30 DIAGNOSIS — N760 Acute vaginitis: Secondary | ICD-10-CM | POA: Diagnosis not present

## 2020-09-14 DIAGNOSIS — N898 Other specified noninflammatory disorders of vagina: Secondary | ICD-10-CM | POA: Diagnosis not present

## 2020-09-27 LAB — BASIC METABOLIC PANEL
Creatinine: 0.9 (ref <=1.1)
Glucose: 96
Potassium: 3.5 (ref 3.4–5.3)

## 2020-09-27 LAB — CBC AND DIFFERENTIAL
Hemoglobin: 15.1 (ref 12.0–16.0)
Platelets: 238 (ref 150–399)

## 2020-09-27 LAB — HEPATIC FUNCTION PANEL
ALT: 19 (ref 7–35)
AST: 21 (ref 13–35)

## 2020-09-27 LAB — LIPID PANEL
Cholesterol: 217 — AB (ref 0–200)
HDL: 65 (ref 35–70)
LDL Cholesterol: 135
Triglycerides: 94 (ref 40–160)

## 2020-09-27 LAB — TSH: TSH: 3.8 (ref <=5.90)

## 2020-09-27 LAB — HEMOGLOBIN A1C: Hemoglobin A1C: 5.5

## 2020-10-04 ENCOUNTER — Encounter: Payer: Self-pay | Admitting: Family Medicine

## 2020-10-11 ENCOUNTER — Other Ambulatory Visit: Payer: Self-pay | Admitting: Family Medicine

## 2020-10-11 NOTE — Telephone Encounter (Signed)
Refill request for lisinopirl-hctz and effexor  LOV - 10/07/19 Next OV - not scheduled; patient did send in recent lab work on [employer].  Last refill - 10/07/19 #90/3 on both

## 2020-10-11 NOTE — Telephone Encounter (Signed)
I printed a copy of her lab work that she submitted.  Please pull it for me so I can mark it up for entering in the EMR.  I sent her refills and I sent her a message in the meantime.  Thanks.

## 2020-11-07 ENCOUNTER — Ambulatory Visit: Payer: BC Managed Care – PPO | Admitting: Family Medicine

## 2020-11-22 ENCOUNTER — Other Ambulatory Visit: Payer: Self-pay | Admitting: Family Medicine

## 2020-11-22 NOTE — Telephone Encounter (Signed)
Sent. Thanks.   

## 2020-11-22 NOTE — Telephone Encounter (Signed)
Refill request for Ambien 10 mg tablets  LOV - 10/07/19 Next OV - 12/05/20 Last refill - 05/19/20 #30/2

## 2020-11-27 ENCOUNTER — Ambulatory Visit: Payer: BC Managed Care – PPO | Admitting: Family Medicine

## 2020-11-28 ENCOUNTER — Ambulatory Visit: Payer: BC Managed Care – PPO | Admitting: Family Medicine

## 2020-12-01 ENCOUNTER — Ambulatory Visit: Payer: BC Managed Care – PPO | Admitting: Internal Medicine

## 2020-12-05 ENCOUNTER — Telehealth (INDEPENDENT_AMBULATORY_CARE_PROVIDER_SITE_OTHER): Payer: BC Managed Care – PPO | Admitting: Family Medicine

## 2020-12-05 ENCOUNTER — Other Ambulatory Visit: Payer: Self-pay

## 2020-12-05 ENCOUNTER — Encounter: Payer: Self-pay | Admitting: Family Medicine

## 2020-12-05 VITALS — Ht 67.0 in | Wt 220.0 lb

## 2020-12-05 DIAGNOSIS — I10 Essential (primary) hypertension: Secondary | ICD-10-CM

## 2020-12-05 DIAGNOSIS — Z0000 Encounter for general adult medical examination without abnormal findings: Secondary | ICD-10-CM

## 2020-12-05 DIAGNOSIS — G4733 Obstructive sleep apnea (adult) (pediatric): Secondary | ICD-10-CM | POA: Diagnosis not present

## 2020-12-05 DIAGNOSIS — Z8249 Family history of ischemic heart disease and other diseases of the circulatory system: Secondary | ICD-10-CM

## 2020-12-05 DIAGNOSIS — G4700 Insomnia, unspecified: Secondary | ICD-10-CM

## 2020-12-05 DIAGNOSIS — K219 Gastro-esophageal reflux disease without esophagitis: Secondary | ICD-10-CM

## 2020-12-05 DIAGNOSIS — F419 Anxiety disorder, unspecified: Secondary | ICD-10-CM

## 2020-12-05 MED ORDER — ESOMEPRAZOLE MAGNESIUM 20 MG PO CPDR: 20.0000 mg | DELAYED_RELEASE_CAPSULE | Freq: Every day | ORAL | Status: DC

## 2020-12-05 NOTE — Progress Notes (Signed)
Virtual visit completed through WebEx or similar program Patient location: at work  Provider location: Adult nurse at [facility], office  Participants: Patient and me (unless stated otherwise below)  Pandemic considerations d/w pt.   Limitations and rationale for visit method d/w patient.  Patient agreed to proceed.   CC: follow up.   HPI:  Mood d/w pt.  Her mother has dementia and is in memory care, recently w/o sig changes.  She has sig baseline memory changes, with some days better than others.  She is frequently checking on her mother, in combination with her sister.   Pt is still on venlafaxine at baseline and prn klonopin.  Mood is influenced by caring for her mother, d/w pt.  Current meds help.    Insomnia.  Taking ambien prn, used 1/2 pill most nights.  No ADE on med.  No parasomnias.  Ambien helps.  Rare GERD and controlled with 20mg  nexium.  No ADE on med.  No vomiting.  No blood in tools.    Hypertension:   Using medication without problems or lightheadedness:  yes Chest pain with exertion:no Edema:no Short of breath:no Recent labs d/w pt.    Vaccines d/w pt.  She can get a flu shot this fall.  D/w pt about shingles vaccine.   Mammogram to be done via GYN 01/2021.   Pap d/w pt, to be done via GYN 01/2021.   [facility].    D/w pt seeing cardiology vs coronary CT scoring.  FH CAD.  Referral to cards ordered.    D/w pt about continued use of CPAP, d/w pt.    Meds and allergies reviewed.   ROS: Per HPI unless specifically indicated in ROS section   NAD Speech wnl  A/P:  Nad Ncat

## 2020-12-06 DIAGNOSIS — Z0000 Encounter for general adult medical examination without abnormal findings: Secondary | ICD-10-CM | POA: Insufficient documentation

## 2020-12-06 DIAGNOSIS — Z8249 Family history of ischemic heart disease and other diseases of the circulatory system: Secondary | ICD-10-CM | POA: Insufficient documentation

## 2020-12-06 NOTE — Assessment & Plan Note (Signed)
Vaccines d/w pt.  She can get a flu shot this fall.  D/w pt about shingles vaccine.   Mammogram to be done via GYN 01/2021.   Pap d/w pt, to be done via GYN 01/2021.   [facility].

## 2020-12-06 NOTE — Assessment & Plan Note (Signed)
Continue Ambien. 

## 2020-12-06 NOTE — Assessment & Plan Note (Signed)
D/w pt seeing cardiology vs coronary CT scoring.  FH CAD.  Referral to cards ordered after discussion with patient.

## 2020-12-06 NOTE — Assessment & Plan Note (Signed)
Continue Nexium 

## 2020-12-06 NOTE — Assessment & Plan Note (Signed)
Continue CPAP.  

## 2020-12-06 NOTE — Assessment & Plan Note (Signed)
Continue lisinopril hydrochlorothiazide continue work on diet and exercise.

## 2020-12-06 NOTE — Assessment & Plan Note (Signed)
Continue venlafaxine with as needed clonazepam.  Support offered.  Okay for outpatient follow-up.  She will update me as needed.

## 2021-01-15 NOTE — Progress Notes (Signed)
Cardiology Office Note:   Date:  01/16/2021  NAME:  Dawn [name]    MRN: [id number] DOB:  01/05/1961   PCP:  Joaquim Nam, MD  Cardiologist:  None  Electrophysiologist:  None   Referring MD: Joaquim Nam, MD   Chief Complaint  Patient presents with   family history of heart disease    History of Present Illness:   Dawn [name] is a 60 y.o. female with a hx of HTN, OSA, HLD who is being seen today for the evaluation of family history of heart disease at the request of Joaquim Nam, MD. she reports both her mother and father had heart disease.  Her father had heart surgery in his later years.  He was diabetic and likely had severe multivessel CAD as he had bypass surgery.  She has never had a heart attack or stroke.  Her medical history is significant for hypertension, obesity and sleep apnea.  She uses her sleep machine.  She describes 2 syncopal events.  1 occurred earlier this year.  She reports it occurred while on the toilet.  She reports dizziness and lightheadedness after using the restroom.  She then passed out.  She had a similar episode that also occurred on the toilet later this year as well.  She describes no red flag symptoms such as chest pain or trouble breathing.  Her EKG is normal in office and she has no murmurs on exam.  She reports that she does not drink enough water.  She actually drinks no water.  She drinks coffee and tea mainly.  I encouraged her to drink more water.  She describes classic vasovagal symptoms that have coincided with syncope.  I believe if she drinks more water she will have less episodes.  Her blood pressure is well controlled today.  She has also HCTZ.  I informed her that if she is not drinking water she will likely get dehydrated BP 114/68.  She has personally never had a heart attack or stroke.  She does not smoke, drink alcohol or use drugs.  She reports no regular exercise.  She reports her diet could be better.   Apparently her mother has dementia and is in memory care.  This is taking a lot of her free time up to take care of her mother.  I do sympathize with her on this.  Overall 10-year ASCVD risk score is 3.4% which is low risk.  Given her family history we discussed calcium scoring which she is interested in doing.  Her EKG today demonstrates sinus rhythm with no acute ischemic changes.  Cardiovascular examination is normal.  She has married and has 1 child.  She has 3 grandchildren.  Problem List HTN OSA HLD -T chol 217, HDL 65, LDL 135, TG 94  Past Medical History: Past Medical History:  Diagnosis Date   Allergy    Anemia    Anxiety    Arthritis    GERD (gastroesophageal reflux disease)    H/O hiatal hernia    HTN (hypertension)    Insomnia    OSA (obstructive sleep apnea)    Urinary incontinence    Uterine polyp    s/p resection    Past Surgical History: Past Surgical History:  Procedure Laterality Date   CHOLECYSTECTOMY     COLONOSCOPY  06/03/2017   TONSILLECTOMY     URETHRAL SLING     VAGINAL DELIVERY      Current Medications: Current Meds  Medication Sig  clonazePAM (KLONOPIN) 0.5 MG tablet TAKE 1 TABLET TWICE DAILY AS NEEDED FOR ANXIETY.   esomeprazole (NEXIUM) 20 MG capsule Take 1 capsule (20 mg total) by mouth daily at 12 noon.   ibuprofen (ADVIL,MOTRIN) 200 MG tablet Take 200 mg by mouth every 6 (six) hours as needed.   lisinopril-hydrochlorothiazide (ZESTORETIC) 20-25 MG tablet TAKE 1 TABLET ONCE DAILY.   venlafaxine XR (EFFEXOR-XR) 150 MG 24 hr capsule TAKE 1 CAPSULE ONCE DAILY WITH BREAKFAST.   zolpidem (AMBIEN) 10 MG tablet TAKE 1/2 TABLET BY MOUTH AT BEDTIME.     Allergies:    Augmentin [amoxicillin-pot clavulanate]   Social History: Social History   Socioeconomic History   Marital status: Married    Spouse name: Not on file   Number of children: 1   Years of education: Not on file   Highest education level: Not on file  Occupational History    Occupation: Teacher, adult education  Tobacco Use   Smoking status: Never   Smokeless tobacco: Never  Vaping Use   Vaping Use: Never used  Substance and Sexual Activity   Alcohol use: Yes    Comment: socially   Drug use: No   Sexual activity: Not on file  Other Topics Concern   Not on file  Social History Narrative   Married 1984   Public relations account executive   Social Determinants of Health   Financial Resource Strain: Not on file  Food Insecurity: Not on file  Transportation Needs: Not on file  Physical Activity: Not on file  Stress: Not on file  Social Connections: Not on file     Family History: The patient'sfamily history includes Arrhythmia in her mother; Breast cancer in her paternal grandmother; Colon polyps in her father, mother, and sister; Coronary artery disease in her father; Diabetes in her father and mother; Diverticulitis in her father; Esophageal cancer in her maternal aunt; Heart attack in her father; Heart disease in her father; Kidney disease in her father; Memory loss in her mother. There is no history of Colon cancer, Rectal cancer, or Stomach cancer.  ROS:   All other ROS reviewed and negative. Pertinent positives noted in the HPI.     EKGs/Labs/Other Studies Reviewed:   The following studies were personally reviewed by me today:  EKG:  EKG is ordered today.  The ekg ordered today demonstrates normal sinus rhythm heart rate 85, no acute ischemic changes or evidence of infarction, and was personally reviewed by me.   Recent Labs: 09/27/2020: ALT 19; Creatinine 0.9; Hemoglobin 15.1; Platelets 238; Potassium 3.5; TSH 3.80   Recent Lipid Panel    Component Value Date/Time   CHOL 217 (A) 09/27/2020 0000   CHOL 200 08/05/2019 0000   CHOL 205 03/05/2017 0000   TRIG 94 09/27/2020 0000   TRIG 99 08/05/2019 0000   HDL 65 09/27/2020 0000   HDL 67 03/05/2017 0000   LDLCALC 135 09/27/2020 0000   LDLCALC 120 08/05/2019 0000    Physical Exam:   VS:  BP 114/68    Pulse 85   Ht 5\' 8"  (1.727 m)   Wt 223 lb 6.4 oz (101.3 kg)   SpO2 98%   BMI 33.97 kg/m    Wt Readings from Last 3 Encounters:  01/16/21 223 lb 6.4 oz (101.3 kg)  12/05/20 220 lb (99.8 kg)  06/01/20 220 lb (99.8 kg)    General: Well nourished, well developed, in no acute distress Head: Atraumatic, normal size  Eyes: PEERLA, EOMI  Neck: Supple, no JVD  Endocrine: No thryomegaly Cardiac: Normal S1, S2; RRR; no murmurs, rubs, or gallops Lungs: Clear to auscultation bilaterally, no wheezing, rhonchi or rales  Abd: Soft, nontender, no hepatomegaly  Ext: No edema, pulses 2+ Musculoskeletal: No deformities, BUE and BLE strength normal and equal Skin: Warm and dry, no rashes   Neuro: Alert and oriented to person, place, time, and situation, CNII-XII grossly intact, no focal deficits  Psych: Normal mood and affect   ASSESSMENT:   Druanne Bosques is a 60 y.o. female who presents for the following: 1. Family history of heart disease   2. Syncope and collapse   3. Primary hypertension   4. Obesity (BMI 30-39.9)     PLAN:   1. Family history of heart disease -Family history of heart disease.  Mainly in her father who had bypass surgery.  Her mother has A. fib which I suspect is age-related.  Her CVD risk factors include OSA, obesity and hypertension.  Hypertension is well controlled.  Her most recent LDL cholesterol is 135.  10-year ASCVD risk or is 3.4% which is low risk.  Overall she does not need statin therapy currently.  However I would recommend a calcium score to determine if she should be on a statin agent.  She is willing to do this.  It is reassuring that her EKG is normal.  She has no symptoms concerning for angina.  I have encouraged her to exercise 150 minutes/week I also encouraged proper diet.  She will work on this.  2. Syncope and collapse -She describes 2 syncopal episodes this year.  They have occurred while using the bathroom.  She does not drink enough water.  She  describes a classic prodrome of sweatiness and syncope.  She has not had any chest pain or trouble breathing.  Her EKG is normal.  Her cardiovascular exam is normal.  I have encouraged her to drink more water and to be aware that she is prone to these episodes.  I suspect dehydration is a big contributor.  No need for stress testing at this time.  She overall is fairly healthy given her normal EKG and normal exam we will hold on any further testing.  3. Primary hypertension -Well-controlled.  No change to medications.  4. Obesity (BMI 30-39.9) -Diet and exercise recommended.      Disposition: Return in about 1 year (around 01/16/2022).  Medication Adjustments/Labs and Tests Ordered: Current medicines are reviewed at length with the patient today.  Concerns regarding medicines are outlined above.  Orders Placed This Encounter  Procedures   CT CARDIAC SCORING (SELF PAY ONLY)   EKG 12-Lead   No orders of the defined types were placed in this encounter.   Patient Instructions  Medication Instructions:  The current medical regimen is effective;  continue present plan and medications.  *If you need a refill on your cardiac medications before your next appointment, please call your pharmacy*  Testing/Procedures: CALCIUM SCORE   Follow-Up: At [facility], you and your health needs are our priority.  As part of our continuing mission to provide you with exceptional heart care, we have created designated Provider Care Teams.  These Care Teams include your primary Cardiologist (physician) and Advanced Practice Providers (APPs -  Physician Assistants and Nurse Practitioners) who all work together to provide you with the care you need, when you need it.  We recommend signing up for the patient portal called "MyChart".  Sign up information is provided on this After Visit Summary.  MyChart is used to connect with patients for Virtual Visits (Telemedicine).  Patients are able to view lab/test  results, encounter notes, upcoming appointments, etc.  Non-urgent messages can be sent to your provider as well.   To learn more about what you can do with MyChart, go to [email or web address].    Your next appointment:   12 month(s)  The format for your next appointment:   In Person  Provider:   Marjie Skiff, PA-C or Azalee Course, PA-C or Lennie Odor, MD     Signed, Lenna Gilford. Flora Lipps, MD, [facility]  [facility]  [address], [address] [place], [place] [address] [phone]  01/16/2021 4:56 PM

## 2021-01-16 ENCOUNTER — Ambulatory Visit: Payer: BC Managed Care – PPO | Admitting: Cardiovascular Disease

## 2021-01-16 ENCOUNTER — Encounter: Payer: Self-pay | Admitting: Cardiovascular Disease

## 2021-01-16 ENCOUNTER — Other Ambulatory Visit: Payer: Self-pay

## 2021-01-16 VITALS — BP 114/68 | HR 85 | Ht 68.0 in | Wt 223.4 lb

## 2021-01-16 DIAGNOSIS — R55 Syncope and collapse: Secondary | ICD-10-CM | POA: Diagnosis not present

## 2021-01-16 DIAGNOSIS — Z8249 Family history of ischemic heart disease and other diseases of the circulatory system: Secondary | ICD-10-CM

## 2021-01-16 DIAGNOSIS — E669 Obesity, unspecified: Secondary | ICD-10-CM

## 2021-01-16 DIAGNOSIS — I10 Essential (primary) hypertension: Secondary | ICD-10-CM

## 2021-01-16 NOTE — Patient Instructions (Signed)
Medication Instructions:  The current medical regimen is effective;  continue present plan and medications.  *If you need a refill on your cardiac medications before your next appointment, please call your pharmacy*  Testing/Procedures: CALCIUM SCORE   Follow-Up: At [facility], you and your health needs are our priority.  As part of our continuing mission to provide you with exceptional heart care, we have created designated Provider Care Teams.  These Care Teams include your primary Cardiologist (physician) and Advanced Practice Providers (APPs -  Physician Assistants and Nurse Practitioners) who all work together to provide you with the care you need, when you need it.  We recommend signing up for the patient portal called "MyChart".  Sign up information is provided on this After Visit Summary.  MyChart is used to connect with patients for Virtual Visits (Telemedicine).  Patients are able to view lab/test results, encounter notes, upcoming appointments, etc.  Non-urgent messages can be sent to your provider as well.   To learn more about what you can do with MyChart, go to [email or web address].    Your next appointment:   12 month(s)  The format for your next appointment:   In Person  Provider:   Marjie Skiff, PA-C or Azalee Course, PA-C or Lennie Odor, MD

## 2021-01-25 ENCOUNTER — Ambulatory Visit: Payer: BC Managed Care – PPO | Admitting: Internal Medicine

## 2021-02-08 DIAGNOSIS — Z01419 Encounter for gynecological examination (general) (routine) without abnormal findings: Secondary | ICD-10-CM | POA: Diagnosis not present

## 2021-02-08 DIAGNOSIS — Z1231 Encounter for screening mammogram for malignant neoplasm of breast: Secondary | ICD-10-CM | POA: Diagnosis not present

## 2021-02-08 DIAGNOSIS — Z6835 Body mass index (BMI) 35.0-35.9, adult: Secondary | ICD-10-CM | POA: Diagnosis not present

## 2021-02-13 ENCOUNTER — Other Ambulatory Visit: Payer: Self-pay | Admitting: Obstetrics and Gynecology

## 2021-02-13 DIAGNOSIS — R928 Other abnormal and inconclusive findings on diagnostic imaging of breast: Secondary | ICD-10-CM

## 2021-02-20 DIAGNOSIS — L603 Nail dystrophy: Secondary | ICD-10-CM | POA: Diagnosis not present

## 2021-02-27 ENCOUNTER — Ambulatory Visit: Payer: BC Managed Care – PPO | Admitting: Internal Medicine

## 2021-03-14 ENCOUNTER — Other Ambulatory Visit: Payer: Self-pay

## 2021-03-14 ENCOUNTER — Ambulatory Visit (INDEPENDENT_AMBULATORY_CARE_PROVIDER_SITE_OTHER)
Admission: RE | Admit: 2021-03-14 | Discharge: 2021-03-14 | Disposition: A | Discharge status: Home / Self Care | Payer: Self-pay | Source: Ambulatory Visit | Attending: Cardiovascular Disease | Admitting: Cardiovascular Disease

## 2021-03-14 DIAGNOSIS — Z8249 Family history of ischemic heart disease and other diseases of the circulatory system: Secondary | ICD-10-CM

## 2021-03-15 ENCOUNTER — Ambulatory Visit: Payer: BC Managed Care – PPO

## 2021-03-15 ENCOUNTER — Ambulatory Visit
Admission: RE | Admit: 2021-03-15 | Discharge: 2021-03-15 | Disposition: A | Discharge status: Home / Self Care | Payer: BC Managed Care – PPO | Source: Ambulatory Visit | Attending: Obstetrics and Gynecology | Admitting: Obstetrics and Gynecology

## 2021-03-15 ENCOUNTER — Other Ambulatory Visit: Payer: BC Managed Care – PPO

## 2021-03-15 DIAGNOSIS — R922 Inconclusive mammogram: Secondary | ICD-10-CM | POA: Diagnosis not present

## 2021-03-15 DIAGNOSIS — R928 Other abnormal and inconclusive findings on diagnostic imaging of breast: Secondary | ICD-10-CM | POA: Diagnosis not present

## 2021-03-15 DIAGNOSIS — N6489 Other specified disorders of breast: Secondary | ICD-10-CM | POA: Diagnosis not present

## 2021-03-26 ENCOUNTER — Encounter: Payer: Self-pay | Admitting: Internal Medicine

## 2021-03-26 DIAGNOSIS — R7303 Prediabetes: Secondary | ICD-10-CM | POA: Diagnosis not present

## 2021-03-28 NOTE — Progress Notes (Deleted)
HPI F never smoker followed for OSA, complicated by HTN, GERD, Insomnia, Paresthesias, Urinary Incontinence, Hyperglycemia, Anxiety, Anemia, Covid infection Jan, 2021,  HST 11/17/19- AHI 28/ hr, desaturation to 67% with mean 88%. CPAP titration 01/09/20- 10 cwp with mean O2 sat 91%.  PFT 06/01/20- WNL ==================================================    06/01/20-  59 yoF never smoker followed for OSA, complicated by .HTN, GERD, Insomnia, Paresthesias, Urinary Incontinence, Hyperglycemia, Anxiety, Anemia, Covid infection Jan, 2021,  -clonazepam 0.5, Ambien 5 mg CPAP auto 5-15/ [facility]- ordered 01/25/20   AirSense 11 AutoSet machine Download- compliance 40%, AHI 2.4/ hr Body weight today- 220 lbs Covid vax- 3Phizer Flu vax-had -----Having connection problems with WiFi. Not wearing it all night Discussed CPAP goals. Settled on FullFace mask. Husband supportive, but in separate bedroom. Some cough as she gets over a cold.  PFT 06/01/20- WNL  03/29/21- 60 yoF never smoker followed for OSA, complicated by .HTN, GERD, Insomnia, Paresthesias, Urinary Incontinence, Hyperglycemia, Anxiety, Anemia, Covid infection Jan, 2021,  -clonazepam 0.5, Ambien 10 mg CPAP auto 5-15/ [facility]- ordered 01/25/20   AirSense 11 AutoSet machine Download- compliance  Body weight today- Covid vax- 3Phizer Flu vax-   ROS-see HPI   + = positive Constitutional:    weight loss, night sweats, fevers, chills, +fatigue, lassitude. HEENT:    +headaches, difficulty swallowing, tooth/dental problems, sore throat,       sneezing, itching, ear ache,+ nasal congestion, post nasal drip, snoring CV:    chest pain, orthopnea, PND, swelling in lower extremities, anasarca,                                   dizziness, palpitations Resp:   shortness of breath with exertion or at rest.                +productive cough,  + non-productive cough, coughing up of blood.              change in color of mucus.  wheezing.   Skin:    rash or  lesions. GI:  + heartburn, +indigestion, abdominal pain, nausea, vomiting, diarrhea,                 change in bowel habits, loss of appetite GU: dysuria, change in color of urine, no urgency or frequency.   flank pain. MS:   +joint pain, stiffness, decreased range of motion, back pain. Neuro-     nothing unusual Psych:  change in mood or affect.  depression or +anxiety.   memory loss.  OBJ- Physical Exam General- Alert, Oriented, Affect-appropriate, Distress- none acute, + obese Skin- rash-none, lesions- none, excoriation- none Lymphadenopathy- none Head- atraumatic            Eyes- Gross vision intact, PERRLA, conjunctivae and secretions clear            Ears- Hearing, canals-normal            Nose- Clear, no-Septal dev, mucus, polyps, erosion, perforation             Throat- Mallampati II-III , mucosa clear , drainage- none, tonsils absent, +teeth Neck- flexible , trachea midline, no stridor , thyroid nl, carotid no bruit Chest - symmetrical excursion , unlabored           Heart/CV- RRR , no murmur , no gallop  , no rub, nl s1 s2                           -  JVD- none , edema- none, stasis changes- none, varices- none           Lung- clear to P&A, wheeze- none, cough- none , dullness-none, rub- none           Chest wall-  Abd-  Br/ Gen/ Rectal- Not done, not indicated Extrem- cyanosis- none, clubbing, none, atrophy- none, strength- nl Neuro- grossly intact to observation

## 2021-03-29 ENCOUNTER — Ambulatory Visit: Payer: BC Managed Care – PPO | Admitting: Internal Medicine

## 2021-04-13 ENCOUNTER — Other Ambulatory Visit: Payer: Self-pay | Admitting: Family Medicine

## 2021-04-24 DIAGNOSIS — R7303 Prediabetes: Secondary | ICD-10-CM | POA: Diagnosis not present

## 2021-05-02 ENCOUNTER — Other Ambulatory Visit: Payer: Self-pay | Admitting: Family Medicine

## 2021-05-02 NOTE — Telephone Encounter (Signed)
Refill request for clonazePAM (KLONOPIN) 0.5 MG tablet ? ?LOV - 12/05/20 ?Next OV - not scheduled ?Last refill - 07/09/20 #10/1 ? ?

## 2021-05-03 ENCOUNTER — Encounter: Payer: Self-pay | Admitting: Family Medicine

## 2021-05-07 NOTE — Progress Notes (Unsigned)
HPI F never smoker followed for OSA, complicated by HTN, GERD, Insomnia, Paresthesias, Urinary Incontinence, Hyperglycemia, Anxiety, Anemia, Covid infection Jan, 2021,  HST 11/17/19- AHI 28/ hr, desaturation to 67% with mean 88%. CPAP titration 01/09/20- 10 cwp with mean O2 sat 91%.  PFT 06/01/20- WNL ==================================================   06/01/20-  59 yoF never smoker followed for OSA, complicated by .HTN, GERD, Insomnia, Paresthesias, Urinary Incontinence, Hyperglycemia, Anxiety, Anemia, Covid infection Jan, 2021,  -clonazepam 0.5, Ambien 5 mg CPAP auto 5-15/ [facility]- ordered 01/25/20   AirSense 11 AutoSet machine Download- compliance 40%, AHI 2.4/ hr Body weight today- 220 lbs Covid vax- 3Phizer Flu vax-had -----Having connection problems with WiFi. Not wearing it all night Discussed CPAP goals. Settled on FullFace mask. Husband supportive, but in separate bedroom. Some cough as she gets over a cold.  PFT 06/01/20- WNL  05/08/21- 60 yoF never smoker followed for OSA, complicated by .HTN, GERD, Insomnia, Paresthesias, Urinary Incontinence, Hyperglycemia, Anxiety, Anemia, Covid infection Jan, 2021,  -clonazepam 0.5, Ambien 5 mg CPAP auto 5-15/ [facility]- ordered 01/25/20   AirSense 11 AutoSet machine Download- compliance  Body weight today- Covid vax- 3Phizer Flu vax-   ROS-see HPI   + = positive Constitutional:    weight loss, night sweats, fevers, chills, +fatigue, lassitude. HEENT:    +headaches, difficulty swallowing, tooth/dental problems, sore throat,       sneezing, itching, ear ache,+ nasal congestion, post nasal drip, snoring CV:    chest pain, orthopnea, PND, swelling in lower extremities, anasarca,                                   dizziness, palpitations Resp:   shortness of breath with exertion or at rest.                +productive cough,  + non-productive cough, coughing up of blood.              change in color of mucus.  wheezing.   Skin:    rash or  lesions. GI:  + heartburn, +indigestion, abdominal pain, nausea, vomiting, diarrhea,                 change in bowel habits, loss of appetite GU: dysuria, change in color of urine, no urgency or frequency.   flank pain. MS:   +joint pain, stiffness, decreased range of motion, back pain. Neuro-     nothing unusual Psych:  change in mood or affect.  depression or +anxiety.   memory loss.  OBJ- Physical Exam General- Alert, Oriented, Affect-appropriate, Distress- none acute, + obese Skin- rash-none, lesions- none, excoriation- none Lymphadenopathy- none Head- atraumatic            Eyes- Gross vision intact, PERRLA, conjunctivae and secretions clear            Ears- Hearing, canals-normal            Nose- Clear, no-Septal dev, mucus, polyps, erosion, perforation             Throat- Mallampati II-III , mucosa clear , drainage- none, tonsils absent, +teeth Neck- flexible , trachea midline, no stridor , thyroid nl, carotid no bruit Chest - symmetrical excursion , unlabored           Heart/CV- RRR , no murmur , no gallop  , no rub, nl s1 s2                           -  JVD- none , edema- none, stasis changes- none, varices- none           Lung- clear to P&A, wheeze- none, cough- none , dullness-none, rub- none           Chest wall-  Abd-  Br/ Gen/ Rectal- Not done, not indicated Extrem- cyanosis- none, clubbing, none, atrophy- none, strength- nl Neuro- grossly intact to observation

## 2021-05-08 ENCOUNTER — Encounter: Payer: Self-pay | Admitting: Internal Medicine

## 2021-05-08 ENCOUNTER — Other Ambulatory Visit: Payer: Self-pay

## 2021-05-08 ENCOUNTER — Ambulatory Visit: Payer: BC Managed Care – PPO | Admitting: Internal Medicine

## 2021-05-08 VITALS — BP 110/80 | HR 80 | Temp 98.1°F | Ht 67.0 in | Wt 233.8 lb

## 2021-05-08 DIAGNOSIS — G4700 Insomnia, unspecified: Secondary | ICD-10-CM | POA: Diagnosis not present

## 2021-05-08 DIAGNOSIS — G4733 Obstructive sleep apnea (adult) (pediatric): Secondary | ICD-10-CM

## 2021-05-08 NOTE — Patient Instructions (Signed)
Order- refer to [facility] for oral appliance to treat OSA ? ?Please call if we can help ?

## 2021-05-18 ENCOUNTER — Encounter: Payer: Self-pay | Admitting: Family Medicine

## 2021-05-18 ENCOUNTER — Other Ambulatory Visit: Payer: Self-pay

## 2021-05-18 ENCOUNTER — Ambulatory Visit: Payer: BC Managed Care – PPO | Admitting: Family Medicine

## 2021-05-18 VITALS — BP 116/80 | HR 80 | Temp 97.9°F | Ht 67.0 in | Wt 233.0 lb

## 2021-05-18 DIAGNOSIS — Z638 Other specified problems related to primary support group: Secondary | ICD-10-CM

## 2021-05-18 DIAGNOSIS — H6980 Other specified disorders of Eustachian tube, unspecified ear: Secondary | ICD-10-CM | POA: Diagnosis not present

## 2021-05-18 MED ORDER — FLUTICASONE PROPIONATE 50 MCG/ACT NA SUSP: 2.0000 | Freq: Every day | NASAL | Status: DC

## 2021-05-18 NOTE — Progress Notes (Signed)
This visit occurred during the SARS-CoV-2 public health emergency.  Safety protocols were in place, including screening questions prior to the visit, additional usage of staff PPE, and extensive cleaning of exam room while observing appropriate contact time as indicated for disinfecting solutions. ? ?Tinnitus.  Going on for years.  L>R.  Hissing sound.  Constant.  Hearing is okay per patient report.  No unilateral loss.  No pain.  No FCNAVD.  ? ?Caregiver strain.  She is still checking on her mother daily.  Either she or her sister check on their mother daily, who is at SNF, followed by hospice with dementia.  I thanked patient for her effort.   ? ?Meds, vitals, and allergies reviewed.  ?ROS: Per HPI unless specifically indicated in ROS section  ? ?GEN: nad, alert and oriented ?HEENT: ncat, TM wnl B, no cerumen impaction.  No visible TM movement with valsalva.   ?NECK: supple w/o LA ?CV: rrr.   ?PULM: ctab, no inc wob ?ABD: soft, +bs ?EXT: no edema ?SKIN: well perfused.   ? ?Weber equal, whisper intact, air > bone conduction B.   ?

## 2021-05-18 NOTE — Patient Instructions (Addendum)
Use flonase and gently try to pop your ears.  ? ?Likely ETD.  ? ?If the tinnitus doesn't get better with that, then we can refer you to ENT but it may not change the management going forward.  ?Update me as needed.  ?

## 2021-05-20 DIAGNOSIS — H6980 Other specified disorders of Eustachian tube, unspecified ear: Secondary | ICD-10-CM | POA: Insufficient documentation

## 2021-05-20 DIAGNOSIS — Z638 Other specified problems related to primary support group: Secondary | ICD-10-CM | POA: Insufficient documentation

## 2021-05-20 NOTE — Assessment & Plan Note (Signed)
See above.  I thanked her for her effort.  She will update me as needed. ?

## 2021-05-20 NOTE — Assessment & Plan Note (Signed)
Discussed options and anatomy. ?Reasonable to use flonase and gently perform Valsalva maneuver.  ? ?If the tinnitus doesn't get better with that, then we can refer her to ENT but it may not change the management going forward.  ?She understood.  She can update me as needed.  ?

## 2021-05-24 ENCOUNTER — Other Ambulatory Visit: Payer: Self-pay | Admitting: Family Medicine

## 2021-05-24 DIAGNOSIS — R7303 Prediabetes: Secondary | ICD-10-CM | POA: Diagnosis not present

## 2021-05-25 NOTE — Telephone Encounter (Signed)
Refill request for zolpidem 10 mg tablet ? ?LOV - 05/18/21 ?Next OV - not scheduled ?Last refill - 11/22/20 #30/2 ? ?

## 2021-05-30 NOTE — Assessment & Plan Note (Addendum)
Noncompliant with CPAP. ?Plan-referral to [facility] to consider oral appliance for treatment of OSA.  Encourage effort at normal body weight. ?

## 2021-05-30 NOTE — Assessment & Plan Note (Signed)
She has used clonazepam or Ambien.  Medication talk done emphasizing safety. ?

## 2021-06-24 DIAGNOSIS — R7303 Prediabetes: Secondary | ICD-10-CM | POA: Diagnosis not present

## 2021-07-09 DIAGNOSIS — D225 Melanocytic nevi of trunk: Secondary | ICD-10-CM | POA: Diagnosis not present

## 2021-07-09 DIAGNOSIS — D485 Neoplasm of uncertain behavior of skin: Secondary | ICD-10-CM | POA: Diagnosis not present

## 2021-07-09 DIAGNOSIS — D2221 Melanocytic nevi of right ear and external auricular canal: Secondary | ICD-10-CM | POA: Diagnosis not present

## 2021-07-09 DIAGNOSIS — L570 Actinic keratosis: Secondary | ICD-10-CM | POA: Diagnosis not present

## 2021-07-09 DIAGNOSIS — D235 Other benign neoplasm of skin of trunk: Secondary | ICD-10-CM | POA: Diagnosis not present

## 2021-07-09 DIAGNOSIS — D2261 Melanocytic nevi of right upper limb, including shoulder: Secondary | ICD-10-CM | POA: Diagnosis not present

## 2021-07-24 DIAGNOSIS — R7303 Prediabetes: Secondary | ICD-10-CM | POA: Diagnosis not present

## 2021-07-27 ENCOUNTER — Telehealth: Payer: BC Managed Care – PPO | Admitting: Family Medicine

## 2021-07-27 DIAGNOSIS — B9689 Other specified bacterial agents as the cause of diseases classified elsewhere: Secondary | ICD-10-CM

## 2021-07-27 DIAGNOSIS — J019 Acute sinusitis, unspecified: Secondary | ICD-10-CM

## 2021-07-27 MED ORDER — DOXYCYCLINE HYCLATE 100 MG PO TABS: 100.0000 mg | ORAL_TABLET | Freq: Two times a day (BID) | ORAL | 0 refills | Status: AC

## 2021-07-27 NOTE — Progress Notes (Signed)

## 2021-09-03 DIAGNOSIS — G4733 Obstructive sleep apnea (adult) (pediatric): Secondary | ICD-10-CM | POA: Diagnosis not present

## 2021-09-25 LAB — LIPID PANEL
Cholesterol: 206 — AB (ref 0–200)
HDL: 61 (ref 35–70)
LDL Cholesterol: 16
Triglycerides: 92 (ref 40–160)

## 2021-09-25 LAB — BASIC METABOLIC PANEL
Creatinine: 1 (ref 0.5–1.1)
Glucose: 101
Potassium: 3.5 mEq/L (ref 3.5–5.1)

## 2021-09-25 LAB — TSH: TSH: 3.11 (ref 0.41–5.90)

## 2021-09-25 LAB — HEPATIC FUNCTION PANEL
ALT: 20 U/L (ref 7–35)
AST: 21 (ref 13–35)

## 2021-09-25 LAB — CBC AND DIFFERENTIAL
Hemoglobin: 14.5 (ref 12.0–16.0)
Platelets: 224 10*3/uL (ref 150–400)

## 2021-09-26 LAB — URIC ACID: Uric Acid: 7.7

## 2021-10-07 ENCOUNTER — Telehealth: Payer: BC Managed Care – PPO | Admitting: Nurse Practitioner

## 2021-10-07 DIAGNOSIS — R21 Rash and other nonspecific skin eruption: Secondary | ICD-10-CM | POA: Diagnosis not present

## 2021-10-07 MED ORDER — TRIAMCINOLONE ACETONIDE 0.1 % EX CREA: 1.0000 | TOPICAL_CREAM | Freq: Two times a day (BID) | CUTANEOUS | 0 refills | Status: DC

## 2021-10-07 MED ORDER — PREDNISONE 20 MG PO TABS: 20.0000 mg | ORAL_TABLET | Freq: Every day | ORAL | 0 refills | Status: AC

## 2021-10-07 NOTE — Progress Notes (Signed)
E-Visit for Eczema  We are sorry that you are not feeling well. Here is how we plan to help! Based on what you shared with me it looks like you have eczema (atopic dermatitis).  Although the cause of eczema is not completely understood, genetics appear to play a strong role, and people with a family history of eczema are at increased risk of developing the condition. In most people with eczema, there is a genetic abnormality in the outermost layer of the skin, called the epidermis   Most people with eczema develop their first symptoms as children, before the age of 33. Intense itching of the skin, patches of redness, small bumps, and skin flaking are common. Scratching can further inflame the skin and worsen the itching. The itchiness may be more noticeable at nighttime.  Eczema commonly affects the back of the neck, the elbow creases, and the backs of the knees. Other affected areas may include the face, wrists, and forearms. The skin may become thickened and darkened, or even scarred, from repeated scratching. Eliminating factors that aggravate your eczema symptoms can help to control the symptoms. Possible triggers may include: ? Cold or dry environments ? Sweating ? Emotional stress or anxiety ? Rapid temperature changes ? Exposure to certain chemicals or cleaning solutions, including soaps and detergents, perfumes and cosmetics, wool or synthetic fibers, dust, sand, and cigarette smoke Keeping your skin hydrated Emollients -- Emollients are creams and ointments that moisturize the skin and prevent it from drying out. The best emollients for people with eczema are thick creams (such as Eucerin, Cetaphil, and Nutraderm) or ointments (such as petroleum jelly, Aquaphor, and Vaseline), which contain little to no water. Emollients are most effective when applied immediately after bathing. Emollients can be applied twice a day or more often if needed. Lotions contain more water than creams and  ointments and are less effective for moisturizing the skin. Bathing -- It is not clear if showers or baths are better for keeping the skin hydrated. Lukewarm baths or showers can hydrate and cool the skin, temporarily relieving itching from eczema. An unscented, mild soap or non-soap cleanser (such as Cetaphil) should be used sparingly. Apply an emollient immediately after bathing or showering to prevent your skin from drying out as a result of water evaporation. Emollient bath additives (products you add to the bath water) have not been found to help relieve symptoms. Hot or long baths (more than 10 to 15 minutes) and showers should be avoided since they can dry out the skin.  Based on what you shared with me you may have eczema.   Triamcinalone ointment (or cream). Apply to the effected areas twice per day. and Prednisone 20 mg tablets. Take one daily by mouth for 5 days  I recommend dilute bleach baths for people with eczema. These baths help to decrease the number of bacteria on the skin that can cause infections or worsen symptoms. To prepare a bleach bath, one-fourth to one-half cup of bleach is placed in a full bathtub (about 40 gallons) of water. Bleach baths are usually taken for 5 to 10 minutes twice per week and should be followed by application of an emollient (listed above). I recommend you take Benadryl 25mg  - 50mg  every 4 hours to control the symptoms (including itching) but if they last over 24 hours it is best that you see an office based provider for follow up.  HOME CARE: Take lukewarm showers or baths Apply creams and ointments to prevent the skin  from drying (Eucerin, Cetaphil, Nutraderm, petroleum jelly, Aquaphor or Vaseline) - these products contain less water than other lotions and are more effective for moisturizing the skin Limit exposure to cold or dry environments, sweating, emotional stress and anxiety, rapid temperature changes and exposure to chemicals and cleaning  products, soaps and detergents, perfumes, cosmetics, wool and synthetic fibers, dust, sand and cigarette- factors which can aggravate eczema symptoms.  Use a hydrocortisone cream once or twice a day Take an antihistamine like Benadryl for widespread rashes that itch.  The adult dosage of Benadryl is 25-50 mg by mouth 4 times daily. Caution: This type of medication may cause sleepiness.  Do not drink alcohol, drive, or operate dangerous machinery while taking antihistamines.  Do not take these medications if you have prostate enlargement.  Read the package instructions thoroughly on all medications that you take.  GET HELP RIGHT AWAY IF: Symptoms that don't go away after treatment. Severe itching that persists. You develop a fever. Your skin begins to drain. You have a sore throat. You become short of breath.  MAKE SURE YOU   Understand these instructions. Will watch your condition. Will get help right away if you are not doing well or get worse.    Thank you for choosing an e-visit.  Your e-visit answers were reviewed by a board certified advanced clinical practitioner to complete your personal care plan. Depending upon the condition, your plan could have included both over the counter or prescription medications.  Please review your pharmacy choice. Make sure the pharmacy is open so you can pick up prescription now. If there is a problem, you may contact your provider through [employer] and have the prescription routed to another pharmacy.  Your safety is important to [place]. If you have drug allergies check your prescription carefully.   For the next 24 hours you can use MyChart to ask questions about today's visit, request a non-urgent call back, or ask for a work or school excuse. You will get an email in the next two days asking about your experience. I hope that your e-visit has been valuable and will speed your recovery.   I have spent at least 5 minutes reviewing and  documenting in the patient's chart.

## 2021-10-10 ENCOUNTER — Encounter: Payer: Self-pay | Admitting: Family Medicine

## 2021-10-10 NOTE — Telephone Encounter (Signed)
Please print a copy of her labs so I can mark them up so some of the results can be entered.  Thanks.

## 2021-10-14 ENCOUNTER — Other Ambulatory Visit: Payer: Self-pay | Admitting: Family Medicine

## 2021-10-31 ENCOUNTER — Other Ambulatory Visit: Payer: Self-pay | Admitting: Family Medicine

## 2021-11-26 ENCOUNTER — Encounter: Payer: Self-pay | Admitting: Family Medicine

## 2021-11-26 ENCOUNTER — Ambulatory Visit (INDEPENDENT_AMBULATORY_CARE_PROVIDER_SITE_OTHER): Payer: BC Managed Care – PPO | Admitting: Family Medicine

## 2021-11-26 VITALS — BP 110/70 | HR 83 | Temp 97.3°F | Ht 67.0 in | Wt 244.0 lb

## 2021-11-26 DIAGNOSIS — G4700 Insomnia, unspecified: Secondary | ICD-10-CM

## 2021-11-26 DIAGNOSIS — Z7189 Other specified counseling: Secondary | ICD-10-CM

## 2021-11-26 DIAGNOSIS — Z23 Encounter for immunization: Secondary | ICD-10-CM

## 2021-11-26 DIAGNOSIS — I10 Essential (primary) hypertension: Secondary | ICD-10-CM

## 2021-11-26 DIAGNOSIS — Z0000 Encounter for general adult medical examination without abnormal findings: Secondary | ICD-10-CM | POA: Diagnosis not present

## 2021-11-26 DIAGNOSIS — F419 Anxiety disorder, unspecified: Secondary | ICD-10-CM

## 2021-11-26 DIAGNOSIS — R21 Rash and other nonspecific skin eruption: Secondary | ICD-10-CM

## 2021-11-26 MED ORDER — CLONAZEPAM 0.5 MG PO TABS: ORAL_TABLET | ORAL | 2 refills | Status: DC

## 2021-11-26 MED ORDER — VENLAFAXINE HCL ER 150 MG PO CP24: ORAL_CAPSULE | ORAL | 3 refills | Status: DC

## 2021-11-26 MED ORDER — FLUOCINONIDE 0.05 % EX CREA: 1.0000 | TOPICAL_CREAM | Freq: Two times a day (BID) | CUTANEOUS | 0 refills | Status: DC | PRN

## 2021-11-26 MED ORDER — ZOLPIDEM TARTRATE 10 MG PO TABS: 5.0000 mg | ORAL_TABLET | Freq: Every day | ORAL | 2 refills | Status: DC

## 2021-11-26 MED ORDER — LISINOPRIL-HYDROCHLOROTHIAZIDE 20-25 MG PO TABS: 1.0000 | ORAL_TABLET | Freq: Every day | ORAL | 3 refills | Status: DC

## 2021-11-26 NOTE — Progress Notes (Unsigned)
CPE- See plan.  Routine anticipatory guidance given to patient.  See health maintenance.  The possibility exists that previously documented standard health maintenance information may have been brought forward from a previous encounter into this note.  If needed, that same information has been updated to reflect the current situation based on today's encounter.    Tetanus 2021. Flu done 2023. PNA due at 65.   Shingles 2023 Covid vaccine prev done.   Colonoscopy 2019.   Mammogram.  [facility] 20[facility].  DXA Done 2019 at [facility].  Pap at [facility].  Living will d/w pt.  Husband designated if patient were incapacitated.    Anxiety.  Still on effexor and klonopin.  No ADE on med.  Her mother died this year from dementia.  Condolences offered.  I thanked patient for her effort.   Hypertension:    Using medication without problems or lightheadedness: yes Chest pain with exertion:no Edema:no Short of breath:no  Taking ambien at night, with relief. No ADE on med.  She is trying to adjust her sleep cycle after her mother passed, d/w pt.    Triamcinolone didn't help much, mild improvement on med.  Posterior scalp still itchy.    PMH and SH reviewed  Meds, vitals, and allergies reviewed.   ROS: Per HPI.  Unless specifically indicated otherwise in HPI, the patient denies:  General: fever. Eyes: acute vision changes ENT: sore throat Cardiovascular: chest pain Respiratory: SOB GI: vomiting GU: dysuria Musculoskeletal: acute back pain Derm: acute rash Neuro: acute motor dysfunction Psych: worsening mood Endocrine: polydipsia Heme: bleeding Allergy: hayfever  GEN: nad, alert and oriented HEENT: ncat NECK: supple w/o LA CV: rrr. PULM: ctab, no inc wob ABD: soft, +bs EXT: no edema SKIN: mild irritation on the back of the neck/posterior scalp.

## 2021-11-26 NOTE — Patient Instructions (Signed)
Let me know if fluocinonide doesn't help.  Use as needed, by next week if not better.   Take care.  Glad to see you.

## 2021-11-28 DIAGNOSIS — R21 Rash and other nonspecific skin eruption: Secondary | ICD-10-CM | POA: Insufficient documentation

## 2021-11-28 NOTE — Assessment & Plan Note (Signed)
Still on effexor and klonopin.  No ADE on med.  Her mother died this year from dementia.  Condolences offered.  I thanked patient for her effort.  Would continue Effexor and Klonopin as is.

## 2021-11-28 NOTE — Assessment & Plan Note (Signed)
Try changing to a fluocinonide and update me as needed.  Routine cautions given to patient.

## 2021-11-28 NOTE — Assessment & Plan Note (Signed)
Continue Ambien. 

## 2021-11-28 NOTE — Assessment & Plan Note (Signed)
Living will d/w pt.  Husband designated if patient were incapacitated.  

## 2021-11-28 NOTE — Assessment & Plan Note (Signed)
Continue lisinopril hydrochlorothiazide.  Continue work on diet and exercise. 

## 2021-11-28 NOTE — Assessment & Plan Note (Signed)
Tetanus 2021. Flu done 2023. PNA due at 65.   Shingles 2023 Covid vaccine prev done.   Colonoscopy 2019.   Mammogram.  Done 2023 at [facility].  DXA Done 2019 at [facility].  Pap at [facility].  Living will d/w pt.  Husband designated if patient were incapacitated.

## 2021-12-04 DIAGNOSIS — L298 Other pruritus: Secondary | ICD-10-CM | POA: Diagnosis not present

## 2022-03-07 DIAGNOSIS — M79645 Pain in left finger(s): Secondary | ICD-10-CM | POA: Insufficient documentation

## 2022-03-08 DIAGNOSIS — M189 Osteoarthritis of first carpometacarpal joint, unspecified: Secondary | ICD-10-CM | POA: Insufficient documentation

## 2022-05-09 ENCOUNTER — Encounter: Payer: Self-pay | Admitting: Gastroenterology

## 2022-05-29 DIAGNOSIS — M545 Low back pain, unspecified: Secondary | ICD-10-CM | POA: Insufficient documentation

## 2022-05-29 DIAGNOSIS — M4316 Spondylolisthesis, lumbar region: Secondary | ICD-10-CM | POA: Insufficient documentation

## 2022-06-04 ENCOUNTER — Other Ambulatory Visit: Payer: Self-pay | Admitting: Family Medicine

## 2022-06-05 NOTE — Telephone Encounter (Signed)
Refill request for zolpidem (AMBIEN) 10 MG tablet   LOV - 11/26/21 Next OV - not scheduled Last refill - 11/26/21 #30/2

## 2022-06-28 DIAGNOSIS — R110 Nausea: Secondary | ICD-10-CM | POA: Insufficient documentation

## 2022-06-28 DIAGNOSIS — G8918 Other acute postprocedural pain: Secondary | ICD-10-CM | POA: Insufficient documentation

## 2022-10-06 ENCOUNTER — Other Ambulatory Visit: Payer: Self-pay | Admitting: Family Medicine

## 2022-10-07 NOTE — Telephone Encounter (Signed)
Refill request for clonazepam 0.5 mg tablet   LOV - 11/26/21 Next OV - not scheduled Last refill - 11/26/21 #10/2

## 2022-10-29 ENCOUNTER — Encounter: Payer: Self-pay | Admitting: Gastroenterology

## 2022-11-27 ENCOUNTER — Ambulatory Visit (AMBULATORY_SURGERY_CENTER): Payer: Self-pay | Admitting: *Deleted

## 2022-11-27 ENCOUNTER — Encounter: Payer: Self-pay | Admitting: Gastroenterology

## 2022-11-27 VITALS — Ht 67.0 in | Wt 250.0 lb

## 2022-11-27 DIAGNOSIS — Z83719 Family history of colon polyps, unspecified: Secondary | ICD-10-CM

## 2022-11-27 MED ORDER — NA SULFATE-K SULFATE-MG SULF 17.5-3.13-1.6 GM/177ML PO SOLN: 1.0000 | Freq: Once | ORAL | 0 refills | Status: AC

## 2022-11-27 NOTE — Progress Notes (Signed)
Pt's name and DOB verified at the beginning of the pre-visit.  Pt denies any difficulty with ambulating,sitting, laying down or rolling side to side Gave both [facility] main # and MD on call # prior to instructions.  No egg or soy allergy known to patient  No issues known to pt with past sedation with any surgeries or procedures Pt denies having issues being intubated Pt has no issues moving head neck or swallowing No FH of Malignant Hyperthermia Pt is not on diet pills Pt is not on home 02  Pt is not on blood thinners  Pt denies issues with constipation  Pt is not on dialysis Pt denise any abnormal heart rhythms  Pt denies any upcoming cardiac testing Pt encouraged to use to use Singlecare or Goodrx to reduce cost  Patient's chart reviewed by Cathlyn Parsons CNRA prior to pre-visit and patient appropriate for the [facility].  Pre-visit completed and red dot placed by patient's name on their procedure day (on provider's schedule).  . Visit by phone Pt states weight is 250lb Instructed pt why it is important to and  to call if they have any changes in health or new medications. Directed them to the # given and on instructions.   Pt states they will.  Instructions reviewed with pt and pt states understanding. Instructed to review again prior to procedure. Pt states they will.  Instructions sent by mail with coupon and by my chart

## 2022-12-11 ENCOUNTER — Encounter: Payer: Self-pay | Admitting: Family Medicine

## 2022-12-12 ENCOUNTER — Encounter: Payer: Self-pay | Admitting: Certified Registered Nurse Anesthetist

## 2022-12-12 ENCOUNTER — Other Ambulatory Visit: Payer: Self-pay | Admitting: Family Medicine

## 2022-12-12 NOTE — Telephone Encounter (Signed)
LAST APPOINTMENT DATE: 11/26/21 CPE    NEXT APPOINTMENT DATE: Visit date not found Patient is due for cpe but do not see where set up.    LAST REFILL: 06/05/22  QTY:#30 2 rf

## 2022-12-12 NOTE — Telephone Encounter (Signed)
Sent. Thanks.   

## 2022-12-16 ENCOUNTER — Encounter: Payer: Self-pay | Admitting: Gastroenterology

## 2022-12-16 ENCOUNTER — Ambulatory Visit: Payer: No Typology Code available for payment source | Admitting: Gastroenterology

## 2022-12-16 VITALS — BP 115/74 | HR 69 | Temp 98.4°F | Resp 14 | Ht 67.0 in | Wt 250.0 lb

## 2022-12-16 DIAGNOSIS — Z83719 Family history of colon polyps, unspecified: Secondary | ICD-10-CM | POA: Diagnosis not present

## 2022-12-16 DIAGNOSIS — K635 Polyp of colon: Secondary | ICD-10-CM | POA: Diagnosis not present

## 2022-12-16 DIAGNOSIS — Z1211 Encounter for screening for malignant neoplasm of colon: Secondary | ICD-10-CM

## 2022-12-16 DIAGNOSIS — D124 Benign neoplasm of descending colon: Secondary | ICD-10-CM

## 2022-12-16 MED ORDER — SODIUM CHLORIDE 0.9 % IV SOLN: 500.0000 mL | INTRAVENOUS | Status: DC

## 2022-12-16 NOTE — Patient Instructions (Addendum)
- Resume previous diet.                           - Continue present medications.                           - Await pathology results. 2 polyps removed.  Handouts given to patient.   YOU HAD AN ENDOSCOPIC PROCEDURE TODAY AT THE [facility]:   Refer to the procedure report that was given to you for any specific questions about what was found during the examination.  If the procedure report does not answer your questions, please call your gastroenterologist to clarify.  If you requested that your care partner not be given the details of your procedure findings, then the procedure report has been included in a sealed envelope for you to review at your convenience later.  YOU SHOULD EXPECT: Some feelings of bloating in the abdomen. Passage of more gas than usual.  Walking can help get rid of the air that was put into your GI tract during the procedure and reduce the bloating. If you had a lower endoscopy (such as a colonoscopy or flexible sigmoidoscopy) you may notice spotting of blood in your stool or on the toilet paper. If you underwent a bowel prep for your procedure, you may not have a normal bowel movement for a few days.  Please Note:  You might notice some irritation and congestion in your nose or some drainage.  This is from the oxygen used during your procedure.  There is no need for concern and it should clear up in a day or so.  SYMPTOMS TO REPORT IMMEDIATELY:  Following lower endoscopy (colonoscopy or flexible sigmoidoscopy):  Excessive amounts of blood in the stool  Significant tenderness or worsening of abdominal pains  Swelling of the abdomen that is new, acute  Fever of 100F or higher   For urgent or emergent issues, a gastroenterologist can be reached at any hour by calling [phone]. Do not use MyChart messaging for urgent concerns.    DIET:  We do recommend a small meal at first, but then you may proceed to your regular diet.  Drink  plenty of fluids but you should avoid alcoholic beverages for 24 hours.  ACTIVITY:  You should plan to take it easy for the rest of today and you should NOT DRIVE or use heavy machinery until tomorrow (because of the sedation medicines used during the test).    FOLLOW UP: Our staff will call the number listed on your records the next business day following your procedure.  We will call around 7:15- 8:00 am to check on you and address any questions or concerns that you may have regarding the information given to you following your procedure. If we do not reach you, we will leave a message.     If any biopsies were taken you will be contacted by phone or by letter within the next 1-3 weeks.  Please call us at [phone] if you have not heard about the biopsies in 3 weeks.    SIGNATURES/CONFIDENTIALITY: You and/or your care partner have signed paperwork which will be entered into your electronic medical record.  These signatures attest to the fact that that the information above on your After Visit Summary has been reviewed and is understood.  Full responsibility of the confidentiality of this discharge information lies with you and/or your care-partner.

## 2022-12-16 NOTE — Progress Notes (Signed)
Called to room to assist during endoscopic procedure.  Patient ID and intended procedure confirmed with present staff. Received instructions for my participation in the procedure from the performing physician.  

## 2022-12-16 NOTE — Progress Notes (Signed)
Pt's states no medical or surgical changes since previsit or office visit. 

## 2022-12-16 NOTE — Op Note (Signed)
[facility] Patient Name: Margaree Trinka Procedure Date: 12/16/2022 8:21 AM MRN: [id number] Endoscopist: Meryl Dare , MD, [phone] Age: 62 Referring MD:  Date of Birth: Nov 09, 1960 Gender: Female Account #: [id number] Procedure:                Colonoscopy Indications:              Colon cancer screening in patient with multiple                            1st-degree relatives with colon polyps Medicines:                Monitored Anesthesia Care Procedure:                Pre-Anesthesia Assessment:                           - Prior to the procedure, a History and Physical                            was performed, and patient medications and                            allergies were reviewed. The patient's tolerance of                            previous anesthesia was also reviewed. The risks                            and benefits of the procedure and the sedation                            options and risks were discussed with the patient.                            All questions were answered, and informed consent                            was obtained. Prior Anticoagulants: The patient has                            taken no anticoagulant or antiplatelet agents. ASA                            Grade Assessment: II - A patient with mild systemic                            disease. After reviewing the risks and benefits,                            the patient was deemed in satisfactory condition to                            undergo the procedure.  After obtaining informed consent, the colonoscope                            was passed under direct vision. Throughout the                            procedure, the patient's blood pressure, pulse, and                            oxygen saturations were monitored continuously. The                            Olympus Scope SN: [id number] was introduced through                            the anus and  advanced to the the cecum, identified                            by appendiceal orifice and ileocecal valve. The                            ileocecal valve, appendiceal orifice, and rectum                            were photographed. The quality of the bowel                            preparation was good. The colonoscopy was performed                            without difficulty. The patient tolerated the                            procedure well. Scope In: 8:34:22 AM Scope Out: 8:50:57 AM Scope Withdrawal Time: 0 hours 12 minutes 13 seconds  Total Procedure Duration: 0 hours 16 minutes 35 seconds  Findings:                 The perianal and digital rectal examinations were                            normal.                           Two sessile polyps were found in the descending                            colon. The polyps were 6 to 7 mm in size. These                            polyps were removed with a cold snare. Resection                            and retrieval were complete.  The exam was otherwise without abnormality on                            direct and retroflexion views. Complications:            No immediate complications. Estimated blood loss:                            None. Estimated Blood Loss:     Estimated blood loss: none. Impression:               - Two 6 to 7 mm polyps in the descending colon,                            removed with a cold snare. Resected and retrieved.                           - The examination was otherwise normal on direct                            and retroflexion views. Recommendation:           - Repeat colonoscopy after studies are complete for                            surveillance based on pathology results.                           - Patient has a contact number available for                            emergencies. The signs and symptoms of potential                            delayed complications were  discussed with the                            patient. Return to normal activities tomorrow.                            Written discharge instructions were provided to the                            patient.                           - Resume previous diet.                           - Continue present medications.                           - Await pathology results. Meryl Dare, MD 12/16/2022 8:57:00 AM This report has been signed electronically.

## 2022-12-16 NOTE — Progress Notes (Signed)
History & Physical  Primary Care Physician:  Joaquim Nam, MD Primary Gastroenterologist: Claudette Head, MD  Impression / Plan:  Family history of colon polyps for colonoscopy.  CHIEF COMPLAINT: Family history of colon polyps   HPI: Dawn [name] is a 62 y.o. female with a family history of colon polyps for colonoscopy.    Past Medical History:  Diagnosis Date   Allergy    Anemia    Anxiety    Arthritis    GERD (gastroesophageal reflux disease)    H/O hiatal hernia    HTN (hypertension)    Insomnia    OSA (obstructive sleep apnea)    Sleep apnea    Urinary incontinence    Uterine polyp    s/p resection    Past Surgical History:  Procedure Laterality Date   CHOLECYSTECTOMY     [facility] surgery Left    Base of thumg   COLONOSCOPY  06/03/2017   TONSILLECTOMY     URETHRAL SLING     VAGINAL DELIVERY      Prior to Admission medications   Medication Sig Start Date End Date Taking? Authorizing Provider  esomeprazole (NEXIUM) 20 MG capsule Take 1 capsule (20 mg total) by mouth daily at 12 noon. Patient taking differently: Take 40 mg by mouth daily at 12 noon. Takes 40 12/05/20  Yes Joaquim Nam, MD  lisinopril-hydrochlorothiazide (ZESTORETIC) 20-25 MG tablet Take 1 tablet by mouth daily. 11/26/21  Yes Joaquim Nam, MD  venlafaxine XR (EFFEXOR-XR) 150 MG 24 hr capsule TAKE ONE CAPSULE BY MOUTH ONCE DAILY WITH BREAKFAST 11/26/21  Yes Joaquim Nam, MD  zolpidem (AMBIEN) 10 MG tablet TAKE 1/2 TABLET BY MOUTH AT BEDTIME 12/12/22  Yes Joaquim Nam, MD  clonazePAM (KLONOPIN) 0.5 MG tablet TAKE ONE TABLET TWICE DAILY AS NEEDED FOR ANXIETY 10/08/22   Joaquim Nam, MD  fluocinonide cream (LIDEX) 0.05 % Apply 1 Application topically 2 (two) times daily as needed. Patient not taking: Reported on 11/27/2022 11/26/21   Joaquim Nam, MD  fluticasone [facility]) 50 MCG/ACT nasal spray Place 2 sprays into both nostrils daily. 05/18/21   Joaquim Nam, MD   ibuprofen (ADVIL,MOTRIN) 200 MG tablet Take 200 mg by mouth every 6 (six) hours as needed.    [provider]    Current Outpatient Medications  Medication Sig Dispense Refill   esomeprazole (NEXIUM) 20 MG capsule Take 1 capsule (20 mg total) by mouth daily at 12 noon. (Patient taking differently: Take 40 mg by mouth daily at 12 noon. Takes 40)     lisinopril-hydrochlorothiazide (ZESTORETIC) 20-25 MG tablet Take 1 tablet by mouth daily. 90 tablet 3   venlafaxine XR (EFFEXOR-XR) 150 MG 24 hr capsule TAKE ONE CAPSULE BY MOUTH ONCE DAILY WITH BREAKFAST 90 capsule 3   zolpidem (AMBIEN) 10 MG tablet TAKE 1/2 TABLET BY MOUTH AT BEDTIME 30 tablet 2   clonazePAM (KLONOPIN) 0.5 MG tablet TAKE ONE TABLET TWICE DAILY AS NEEDED FOR ANXIETY 10 tablet 2   fluocinonide cream (LIDEX) 0.05 % Apply 1 Application topically 2 (two) times daily as needed. (Patient not taking: Reported on 11/27/2022) 30 g 0   fluticasone (FLONASE) 50 MCG/ACT nasal spray Place 2 sprays into both nostrils daily.     ibuprofen (ADVIL,MOTRIN) 200 MG tablet Take 200 mg by mouth every 6 (six) hours as needed.     Current Facility-Administered Medications  Medication Dose Route Frequency Provider Last Rate Last Admin   0.9 %  sodium  chloride infusion  500 mL Intravenous Continuous Meryl Dare, MD        Allergies as of 12/16/2022 - Review Complete 12/16/2022  Allergen Reaction Noted   Augmentin [amoxicillin-pot clavulanate]  09/22/2018   Levonorgestrel-eth estradiol [levonorgestrel-ethinyl estrad] Other (See Comments) 02/25/1998    Family History  Problem Relation Age of Onset   Arrhythmia Mother    Diabetes Mother    Memory loss Mother    Colon polyps Mother    Heart attack Father    Heart disease Father    Diabetes Father    Kidney disease Father    Colon polyps Father    Diverticulitis Father        also small bowel blockages with colectomy x 2    Coronary artery disease Father    Colon polyps Sister     Breast cancer Paternal Grandmother    Esophageal cancer Maternal Aunt    Colon cancer Neg Hx    Rectal cancer Neg Hx    Stomach cancer Neg Hx     Social History   Socioeconomic History   Marital status: Married    Spouse name: Not on file   Number of children: 1   Years of education: Not on file   Highest education level: Not on file  Occupational History   Occupation: Teacher, adult education  Tobacco Use   Smoking status: Never   Smokeless tobacco: Never  Vaping Use   Vaping status: Never Used  Substance and Sexual Activity   Alcohol use: Yes    Comment: socially   Drug use: No   Sexual activity: Not on file  Other Topics Concern   Not on file  Social History Narrative   Married 1984   Public relations account executive   Social Determinants of Health   Financial Resource Strain: Not on file  Food Insecurity: Not on file  Transportation Needs: Not on file  Physical Activity: Not on file  Stress: Not on file (04/06/2019)  Social Connections: Not on file  Intimate Partner Violence: Low Risk  (06/11/2019)   Received [facility]   Intimate Partner Violence    Insults You: Not on file    Threatens You: Not on file    Screams at You: Not on file    Physically Hurt: Not on file    Intimate Partner Violence Score: Not on file    Review of Systems:  All systems reviewed were negative except where noted in HPI.   Physical Exam:  General:  Alert, well-developed, in NAD Head:  Normocephalic and atraumatic. Eyes:  Sclera clear, no icterus.   Conjunctiva pink. Ears:  Normal auditory acuity. Mouth:  No deformity or lesions.  Neck:  Supple; no masses. Lungs:  Clear throughout to auscultation.   No wheezes, crackles, or rhonchi.  Heart:  Regular rate and rhythm; no murmurs. Abdomen:  Soft, nondistended, nontender. No masses, hepatomegaly. No palpable masses.  Normal bowel sounds.    Rectal:  Deferred   Msk:  Symmetrical without gross deformities. Extremities:   Without edema. Neurologic:  Alert and  oriented x 4; grossly normal neurologically. Skin:  Intact without significant lesions or rashes. Psych:  Alert and cooperative. Normal mood and affect.   Venita Lick. Russella Dar  12/16/2022, 8:29 AM See Loretha Stapler, [facility], to contact our on call provider

## 2022-12-16 NOTE — Progress Notes (Signed)
Report given to PACU, vss 

## 2022-12-17 ENCOUNTER — Telehealth: Payer: Self-pay | Admitting: *Deleted

## 2022-12-17 NOTE — Telephone Encounter (Signed)
No answer on  follow up call. Left message.   

## 2022-12-18 ENCOUNTER — Encounter: Payer: Self-pay | Admitting: Gastroenterology

## 2022-12-18 LAB — SURGICAL PATHOLOGY

## 2022-12-19 ENCOUNTER — Telehealth: Payer: No Typology Code available for payment source | Admitting: Physician Assistant

## 2022-12-19 DIAGNOSIS — J069 Acute upper respiratory infection, unspecified: Secondary | ICD-10-CM | POA: Diagnosis not present

## 2022-12-19 MED ORDER — FLUTICASONE PROPIONATE 50 MCG/ACT NA SUSP: 2.0000 | Freq: Every day | NASAL | 6 refills | Status: AC

## 2022-12-19 MED ORDER — CETIRIZINE HCL 10 MG PO TABS: 10.0000 mg | ORAL_TABLET | Freq: Every day | ORAL | 0 refills | Status: DC

## 2022-12-19 NOTE — Patient Instructions (Signed)
Micael Hampshire, thank you for joining Laure Kidney, PA-C for today's virtual visit.  While this provider is not your primary care provider (PCP), if your PCP is located in our provider database this encounter information will be shared with them immediately following your visit.   A [facility] MyChart account gives you access to today's visit and all your visits, tests, and labs performed at [facility] " click here if you don't have a [facility] MyChart account or go to [email or web address]  Consent: (Patient) Dawn [name] provided verbal consent for this virtual visit at the beginning of the encounter.  Current Medications:  Current Outpatient Medications:    cetirizine (ZYRTEC ALLERGY) 10 MG tablet, Take 1 tablet (10 mg total) by mouth daily for 14 days., Disp: 14 tablet, Rfl: 0   fluticasone (FLONASE) 50 MCG/ACT nasal spray, Place 2 sprays into both nostrils daily., Disp: 16 g, Rfl: 6   clonazePAM (KLONOPIN) 0.5 MG tablet, TAKE ONE TABLET TWICE DAILY AS NEEDED FOR ANXIETY, Disp: 10 tablet, Rfl: 2   esomeprazole (NEXIUM) 20 MG capsule, Take 1 capsule (20 mg total) by mouth daily at 12 noon. (Patient taking differently: Take 40 mg by mouth daily at 12 noon. Takes 40), Disp: , Rfl:    fluocinonide cream (LIDEX) 0.05 %, Apply 1 Application topically 2 (two) times daily as needed. (Patient not taking: Reported on 11/27/2022), Disp: 30 g, Rfl: 0   ibuprofen (ADVIL,MOTRIN) 200 MG tablet, Take 200 mg by mouth every 6 (six) hours as needed., Disp: , Rfl:    lisinopril-hydrochlorothiazide (ZESTORETIC) 20-25 MG tablet, Take 1 tablet by mouth daily., Disp: 90 tablet, Rfl: 3   venlafaxine XR (EFFEXOR-XR) 150 MG 24 hr capsule, TAKE ONE CAPSULE BY MOUTH ONCE DAILY WITH BREAKFAST, Disp: 90 capsule, Rfl: 3   zolpidem (AMBIEN) 10 MG tablet, TAKE 1/2 TABLET BY MOUTH AT BEDTIME, Disp: 30 tablet, Rfl: 2   Medications ordered in this encounter:  Meds ordered this encounter   Medications   fluticasone (FLONASE) 50 MCG/ACT nasal spray    Sig: Place 2 sprays into both nostrils daily.    Dispense:  16 g    Refill:  6    Order Specific Question:   Supervising Provider    Answer:   Merrilee Jansky [[id number]]   cetirizine (ZYRTEC ALLERGY) 10 MG tablet    Sig: Take 1 tablet (10 mg total) by mouth daily for 14 days.    Dispense:  14 tablet    Refill:  0    Order Specific Question:   Supervising Provider    Answer:   Merrilee Jansky [id number]     *If you need refills on other medications prior to your next appointment, please contact your pharmacy*  Follow-Up: Call back or seek an in-person evaluation if the symptoms worsen or if the condition fails to improve as anticipated.  [facility] [phone]  Other Instructions Zyrtec and Flonase for congestion and runny nose. You may take tylenol as directed on the packaging for fever or headache.  Should your symptoms worsen. Please return for evaluation or present to the nearest ER/Urgent Care.    If you have been instructed to have an in-person evaluation today at a local Urgent Care facility, please use the link below. It will take you to a list of all of our available [facility], including address, phone number and hours of operation. Please do not delay care.  [facility]  If you or a family member do not have a primary care provider, use the link below to schedule a visit and establish care. When you choose a [facility] primary care physician or advanced practice provider, you gain a long-term partner in health. Find a Primary Care Provider  Learn more about [facility]'s in-office and virtual care options:  - Get Care Now

## 2022-12-19 NOTE — Progress Notes (Signed)
Virtual Visit Consent   [name], you are scheduled for a virtual visit with a [facility] provider today. Just as with appointments in the office, your consent must be obtained to participate. Your consent will be active for this visit and any virtual visit you may have with one of our providers in the next 365 days. If you have a MyChart account, a copy of this consent can be sent to you electronically.  As this is a virtual visit, video technology does not allow for your provider to perform a traditional examination. This may limit your provider's ability to fully assess your condition. If your provider identifies any concerns that need to be evaluated in person or the need to arrange testing (such as labs, EKG, etc.), we will make arrangements to do so. Although advances in technology are sophisticated, we cannot ensure that it will always work on either your end or our end. If the connection with a video visit is poor, the visit may have to be switched to a telephone visit. With either a video or telephone visit, we are not always able to ensure that we have a secure connection.  By engaging in this virtual visit, you consent to the provision of healthcare and authorize for your insurance to be billed (if applicable) for the services provided during this visit. Depending on your insurance coverage, you may receive a charge related to this service.  I need to obtain your verbal consent now. Are you willing to proceed with your visit today? Dawn [name] has provided verbal consent on 12/19/2022 for a virtual visit (video or telephone). Laure Kidney, [place]  Date: 12/19/2022 6:19 PM  Virtual Visit via Video Note   I, Laure Kidney, connected with  Dawn [name]  ([id number], Dec 30, 1960) on 12/19/22 at  6:15 PM EDT by a video-enabled telemedicine application and verified that I am speaking with the correct person using two identifiers.  Location: Patient: Virtual  Visit Location Patient: Home Provider: Virtual Visit Location Provider: Home Office   I discussed the limitations of evaluation and management by telemedicine and the availability of in person appointments. The patient expressed understanding and agreed to proceed.    History of Present Illness: Dawn [name] is a 62 y.o. who identifies as a female who was assigned female at birth, and is being seen today for URI symptoms.Marland Kitchen  HPI: URI  This is a new problem. The current episode started in the past 7 days. The problem has been waxing and waning. There has been no fever. Associated symptoms include congestion, headaches and rhinorrhea. Treatments tried: OTC advil cold and flu. The treatment provided mild relief.    Problems:  Patient Active Problem List   Diagnosis Date Noted   Rash 11/28/2021   Dysfunction of eustachian tube 05/20/2021   Healthcare maintenance 12/06/2020   FH: CAD (coronary artery disease) 12/06/2020   Nocturnal hypoxemia 01/25/2020   OSA (obstructive sleep apnea) 10/19/2019   Routine general medical examination at a health care facility 08/13/2018   Hyperglycemia 03/26/2017   Advance care planning 07/10/2016   Urinary incontinence    Insomnia    HTN (hypertension)    GERD (gastroesophageal reflux disease)    Anxiety     Allergies:  Allergies  Allergen Reactions   Augmentin [Amoxicillin-Pot Clavulanate]     Severe yeast infection   Levonorgestrel-Eth Estradiol [Levonorgestrel-Ethinyl Estrad] Other (See Comments)    Other reaction(s): eye redness, headache, lightheadedness   Medications:  Current Outpatient Medications:  cetirizine (ZYRTEC ALLERGY) 10 MG tablet, Take 1 tablet (10 mg total) by mouth daily for 14 days., Disp: 14 tablet, Rfl: 0   fluticasone (FLONASE) 50 MCG/ACT nasal spray, Place 2 sprays into both nostrils daily., Disp: 16 g, Rfl: 6   clonazePAM (KLONOPIN) 0.5 MG tablet, TAKE ONE TABLET TWICE DAILY AS NEEDED FOR ANXIETY, Disp: 10  tablet, Rfl: 2   esomeprazole (NEXIUM) 20 MG capsule, Take 1 capsule (20 mg total) by mouth daily at 12 noon. (Patient taking differently: Take 40 mg by mouth daily at 12 noon. Takes 40), Disp: , Rfl:    fluocinonide cream (LIDEX) 0.05 %, Apply 1 Application topically 2 (two) times daily as needed. (Patient not taking: Reported on 11/27/2022), Disp: 30 g, Rfl: 0   ibuprofen (ADVIL,MOTRIN) 200 MG tablet, Take 200 mg by mouth every 6 (six) hours as needed., Disp: , Rfl:    lisinopril-hydrochlorothiazide (ZESTORETIC) 20-25 MG tablet, Take 1 tablet by mouth daily., Disp: 90 tablet, Rfl: 3   venlafaxine XR (EFFEXOR-XR) 150 MG 24 hr capsule, TAKE ONE CAPSULE BY MOUTH ONCE DAILY WITH BREAKFAST, Disp: 90 capsule, Rfl: 3   zolpidem (AMBIEN) 10 MG tablet, TAKE 1/2 TABLET BY MOUTH AT BEDTIME, Disp: 30 tablet, Rfl: 2  Observations/Objective: Patient is well-developed, well-nourished in no acute distress.  Resting comfortably  at home.  Head is normocephalic, atraumatic.  No labored breathing.  Speech is clear and coherent with logical content.  Patient is alert and oriented at baseline.    Assessment and Plan: 1. Viral URI Zyrtec and Claritin prescribed. OTC Tylenol for headache or fevers should they develop.  Follow Up Instructions: I discussed the assessment and treatment plan with the patient. The patient was provided an opportunity to ask questions and all were answered. The patient agreed with the plan and demonstrated an understanding of the instructions.  A copy of instructions were sent to the patient via MyChart unless otherwise noted below.   The patient was advised to call back or seek an in-person evaluation if the symptoms worsen or if the condition fails to improve as anticipated.    Laure Kidney, PA-C

## 2022-12-26 ENCOUNTER — Encounter: Payer: Self-pay | Admitting: Family Medicine

## 2022-12-26 ENCOUNTER — Ambulatory Visit: Payer: No Typology Code available for payment source | Admitting: Family Medicine

## 2022-12-26 VITALS — BP 132/74 | HR 90 | Temp 99.0°F | Ht 67.0 in | Wt 252.8 lb

## 2022-12-26 DIAGNOSIS — J0100 Acute maxillary sinusitis, unspecified: Secondary | ICD-10-CM | POA: Diagnosis not present

## 2022-12-26 MED ORDER — AZITHROMYCIN 250 MG PO TABS: ORAL_TABLET | ORAL | 0 refills | Status: AC

## 2022-12-26 NOTE — Progress Notes (Signed)
Patient ID: Dawn [name], female    DOB: [birth year], 62 y.o.   MRN: [id number]  This visit was conducted in person.  BP 132/74   Pulse 90   Temp 99 F (37.2 C)   Ht 5\' 7"  (1.702 m)   Wt 252 lb 12.8 oz (114.7 kg)   LMP 12/03/2016   SpO2 95%   BMI 39.59 kg/m    CC:  Chief Complaint  Patient presents with   Sinus Problem    C/o     Sxs started 2 weeks ago. Took flonase and Careers adviser. Mostly on left side of head    Subjective:   HPI: Dawn [name] is a 62 y.o. female  presenting on 12/26/2022 for Sinus Problem (C/o     Sxs started 2 weeks ago. Took flonase and Careers adviser. Mostly on left side of head)  1 month ago went to [employer]... Not sure if had COVID, had fever, cough and congestion... that resolved except congestion Date of onset:  2 weeks Initial symptoms included  congestion  Virtual visit last week .Marland Kitchen Felt viral ... Told to try antihismaine. Symptoms progressed to left side face pain, pressure.  Minimal discharge.  Post nasal drop.  NO SOB, no wheeze.    Sick contacts:  none COVID testing:   none    She has tried to treat with flonase and allegra    No history of chronic lung disease such as asthma or COPD. Non-smoker.      Relevant past medical, surgical, family and social history reviewed and updated as indicated. Interim medical history since our last visit reviewed. Allergies and medications reviewed and updated. Outpatient Medications Prior to Visit  Medication Sig Dispense Refill   cetirizine (ZYRTEC ALLERGY) 10 MG tablet Take 1 tablet (10 mg total) by mouth daily for 14 days. 14 tablet 0   clonazePAM (KLONOPIN) 0.5 MG tablet TAKE ONE TABLET TWICE DAILY AS NEEDED FOR ANXIETY 10 tablet 2   esomeprazole (NEXIUM) 20 MG capsule Take 1 capsule (20 mg total) by mouth daily at 12 noon. (Patient taking differently: Take 40 mg by mouth daily at 12 noon. Takes 40)     fluocinonide cream (LIDEX) 0.05 % Apply 1 Application topically 2 (two) times  daily as needed. 30 g 0   fluticasone (FLONASE) 50 MCG/ACT nasal spray Place 2 sprays into both nostrils daily. 16 g 6   ibuprofen (ADVIL,MOTRIN) 200 MG tablet Take 200 mg by mouth every 6 (six) hours as needed.     lisinopril-hydrochlorothiazide (ZESTORETIC) 20-25 MG tablet Take 1 tablet by mouth daily. 90 tablet 3   venlafaxine XR (EFFEXOR-XR) 150 MG 24 hr capsule TAKE ONE CAPSULE BY MOUTH ONCE DAILY WITH BREAKFAST 90 capsule 3   zolpidem (AMBIEN) 10 MG tablet TAKE 1/2 TABLET BY MOUTH AT BEDTIME 30 tablet 2   No facility-administered medications prior to visit.     Per HPI unless specifically indicated in ROS section below Review of Systems  Constitutional:  Negative for fatigue and fever.  HENT:  Positive for congestion, postnasal drip, sinus pressure and sinus pain. Negative for sneezing and sore throat.   Eyes:  Negative for pain.  Respiratory:  Negative for cough and shortness of breath.   Cardiovascular:  Negative for chest pain, palpitations and leg swelling.  Gastrointestinal:  Negative for abdominal pain.  Genitourinary:  Negative for dysuria and vaginal bleeding.  Musculoskeletal:  Negative for back pain.  Neurological:  Negative for syncope, light-headedness and headaches.  Psychiatric/Behavioral:  Negative for dysphoric mood.    Objective:  BP 132/74   Pulse 90   Temp 99 F (37.2 C)   Ht 5\' 7"  (1.702 m)   Wt 252 lb 12.8 oz (114.7 kg)   LMP 12/03/2016   SpO2 95%   BMI 39.59 kg/m   Wt Readings from Last 3 Encounters:  12/26/22 252 lb 12.8 oz (114.7 kg)  12/16/22 250 lb (113.4 kg)  11/27/22 250 lb (113.4 kg)      Physical Exam Constitutional:      General: She is not in acute distress.    Appearance: She is well-developed. She is not ill-appearing or toxic-appearing.  HENT:     Head: Normocephalic.     Right Ear: Hearing, ear canal and external ear normal. A middle ear effusion is present. Tympanic membrane is not erythematous, retracted or bulging.     Left  Ear: Hearing, ear canal and external ear normal. A middle ear effusion is present. Tympanic membrane is not erythematous, retracted or bulging.     Nose: Mucosal edema and rhinorrhea present.     Right Turbinates: Swollen.     Left Turbinates: Swollen.     Right Sinus: No maxillary sinus tenderness or frontal sinus tenderness.     Left Sinus: No maxillary sinus tenderness or frontal sinus tenderness.     Mouth/Throat:     Mouth: Oropharynx is clear and moist and mucous membranes are normal.     Pharynx: Uvula midline.  Eyes:     General: Lids are normal. Lids are everted, no foreign bodies appreciated.     Extraocular Movements: EOM normal.     Conjunctiva/sclera: Conjunctivae normal.     Pupils: Pupils are equal, round, and reactive to light.  Neck:     Thyroid: No thyroid mass or thyromegaly.     Vascular: No carotid bruit.     Trachea: Trachea normal.  Cardiovascular:     Rate and Rhythm: Normal rate and regular rhythm.     Pulses: Normal pulses.     Heart sounds: Normal heart sounds, S1 normal and S2 normal. No murmur heard.    No friction rub. No gallop.  Pulmonary:     Effort: Pulmonary effort is normal. No tachypnea or respiratory distress.     Breath sounds: Normal breath sounds. No decreased breath sounds, wheezing, rhonchi or rales.  Musculoskeletal:     Cervical back: Normal range of motion and neck supple.  Skin:    General: Skin is warm, dry and intact.     Findings: No rash.  Neurological:     Mental Status: She is alert.  Psychiatric:        Mood and Affect: Mood is not anxious or depressed.        Speech: Speech normal.        Behavior: Behavior normal. Behavior is cooperative.        Cognition and Memory: Cognition and memory normal.        Judgment: Judgment normal.       Results for orders placed or performed in visit on 12/16/22  Surgical pathology ([facility])  Result Value Ref Range   SURGICAL PATHOLOGY      SURGICAL PATHOLOGY [facility] [address] [place], [place] [address] Telephone [phone] or [phone] Fax [phone]  REPORT OF SURGICAL PATHOLOGY   Accession #: [id number] Patient Name: Dawn [name] Visit # : [id number]  MRN: [id number] Physician: Claudette Head DOB/Age February 16, 1961 (  Age: 7) Gender: F Collected Date: 12/16/2022 Received Date: 12/17/2022  FINAL DIAGNOSIS       1. Surgical [P], colon, descending, polyp (2) :       -  HYPERPLASTIC POLYP WITH SOME FEATURES OF A SESSILE SERRATED LESION.       DATE SIGNED OUT: 12/18/2022 ELECTRONIC SIGNATURE : Barkley Boards, Pathologist, Electronic Signature  MICROSCOPIC DESCRIPTION  CASE COMMENTS STAINS USED IN DIAGNOSIS: H&E    CLINICAL HISTORY  SPECIMEN(S) OBTAINED 1. Surgical [P], Colon, Descending, Polyp (2)  SPECIMEN COMMENTS: 1. Family history of colon polyps, unspecified; benign neoplasm of descending colon SPECIMEN CLINICAL INFORMATION:  1. R/O adenoma    Gross Description 1. Received in formalin are tan, soft tissue fragments that are submitted in toto. Number: 2, Size: 0.4 cm smallest to 0.5 cm largest, (1B) ( TA )        Report signed out from the following location(s) [place]. [facility] [address], [place] [address] CLIA #: [id number]  [facility] [address] [place], [place] [address] CLIA #: [id number]     Assessment and Plan  Acute non-recurrent maxillary sinusitis Assessment & Plan: Acute, most likely initial viral infection now with likely bacterial superinfection given unilateral face pain ongoing greater than 7 to 10 days. Treat with azithromycin given side effects to Augmentin in the past.  Continue Flonase 2 sprays per nostril daily and start nasal saline irrigation.  If continued pressure and pain she will call to consider prednisone taper.  Return and ER precautions provided   Other orders -      Azithromycin; Take 2 tablets on day 1, then 1 tablet daily on days 2 through 5  Dispense: 6 tablet; Refill: 0    No follow-ups on file.   Kerby Nora, MD

## 2022-12-26 NOTE — Assessment & Plan Note (Addendum)
Acute, most likely initial viral infection now with likely bacterial superinfection given unilateral face pain ongoing greater than 7 to 10 days. Treat with azithromycin given side effects to Augmentin in the past.  Continue Flonase 2 sprays per nostril daily and start nasal saline irrigation.  If continued pressure and pain she will call to consider prednisone taper.  Return and ER precautions provided

## 2022-12-31 ENCOUNTER — Telehealth: Payer: Self-pay | Admitting: Internal Medicine

## 2022-12-31 NOTE — Telephone Encounter (Signed)
Patient needs cpap setting adjustments. She was using a mouth piece and hasn't used her cpap in a while but would like to switch back and use her CPAP machine. Please call and advise.

## 2023-01-07 NOTE — Telephone Encounter (Signed)
Called and set pt up for a OV. Nfn at this time

## 2023-01-07 NOTE — Telephone Encounter (Signed)
Pt hasn't used her Cpap in a while and wants to make sure her setting are correct, pls advise

## 2023-01-20 ENCOUNTER — Other Ambulatory Visit: Payer: Self-pay | Admitting: Family Medicine

## 2023-02-07 ENCOUNTER — Encounter: Payer: Self-pay | Admitting: Family Medicine

## 2023-02-07 DIAGNOSIS — Z01419 Encounter for gynecological examination (general) (routine) without abnormal findings: Secondary | ICD-10-CM

## 2023-02-12 ENCOUNTER — Other Ambulatory Visit: Payer: Self-pay | Admitting: Family Medicine

## 2023-03-07 ENCOUNTER — Telehealth: Payer: Self-pay

## 2023-03-07 NOTE — Telephone Encounter (Signed)
 ATC pt x1. Lvm asking for a call back. I tried to call the pt in regards to her appointment with Landry, NP. She has a cpap f/u on Monday (03-10-23) and I was unable to print a DL for her. I need to know if she can bring her SD card with her on Monday or if she is even using her cpap. If the pt is not using the cpap, her appointment needs to be canceled.

## 2023-03-10 ENCOUNTER — Encounter (HOSPITAL_BASED_OUTPATIENT_CLINIC_OR_DEPARTMENT_OTHER): Payer: Self-pay

## 2023-03-10 ENCOUNTER — Encounter: Payer: Self-pay | Admitting: Primary Care

## 2023-03-10 ENCOUNTER — Ambulatory Visit (INDEPENDENT_AMBULATORY_CARE_PROVIDER_SITE_OTHER): Payer: 59 | Admitting: Primary Care

## 2023-03-10 VITALS — BP 125/73 | HR 86 | Temp 96.0°F | Ht 67.0 in | Wt 254.6 lb

## 2023-03-10 DIAGNOSIS — G4733 Obstructive sleep apnea (adult) (pediatric): Secondary | ICD-10-CM

## 2023-03-10 DIAGNOSIS — G4700 Insomnia, unspecified: Secondary | ICD-10-CM

## 2023-03-10 NOTE — Patient Instructions (Signed)
 Please upload sleep study fro [facility], we may be able to get you supplemental oxygen to use with oral appliance, otherwise, if we need to resume CPAP we will need to repeat sleep study (I will discuss at home vs in-lab testing with Dr. Neysa)   You do meet qualifications for inspire (moderate-severe OSA, previous tried and failed CPAP and BMI is <40). We have give you patient information about Inspire device, if you would like to proceed with this we would set you up for sleep endoscopy (to assess how your airway closes when you're asleep) prior to seeing ENT who would do the procedure   Follow-up 3 months with Dr. Neysa or [facility] NP   Sleep Apnea Sleep apnea affects breathing during sleep. It causes breathing to stop for 10 seconds or more, or to become shallow. People with sleep apnea usually snore loudly. It can also increase the risk of: Heart attack. Stroke. Being very overweight (obese). Diabetes. Heart failure. Irregular heartbeat. High blood pressure. The goal of treatment is to help you breathe normally again. What are the causes?  The most common cause of this condition is a collapsed or blocked airway. There are three kinds of sleep apnea: Obstructive sleep apnea. This is caused by a blocked or collapsed airway. Central sleep apnea. This happens when the brain does not send the right signals to the muscles that control breathing. Mixed sleep apnea. This is a combination of obstructive and central sleep apnea. What increases the risk? Being overweight. Smoking. Having a small airway. Being older. Being female. Drinking alcohol. Taking medicines to calm yourself (sedatives or tranquilizers). Having family members with the condition. Having a tongue or tonsils that are larger than normal. What are the signs or symptoms? Trouble staying asleep. Loud snoring. Headaches in the morning. Waking up gasping. Dry mouth or sore throat in the morning. Being sleepy or tired  during the day. If you are sleepy or tired during the day, you may also: Not be able to focus your mind (concentrate). Forget things. Get angry a lot and have mood swings. Feel sad (depressed). Have changes in your personality. Have less interest in sex, if you are female. Be unable to have an erection, if you are female. How is this treated?  Sleeping on your side. Using a medicine to get rid of mucus in your nose (decongestant). Avoiding the use of alcohol, medicines to help you relax, or certain pain medicines (narcotics). Losing weight, if needed. Changing your diet. Quitting smoking. Using a machine to open your airway while you sleep, such as: An oral appliance. This is a mouthpiece that shifts your lower jaw forward. A CPAP device. This device blows air through a mask when you breathe out (exhale). An EPAP device. This has valves that you put in each nostril. A BIPAP device. This device blows air through a mask when you breathe in (inhale) and breathe out. Having surgery if other treatments do not work. Follow these instructions at home: Lifestyle Make changes that your doctor recommends. Eat a healthy diet. Lose weight if needed. Avoid alcohol, medicines to help you relax, and some pain medicines. Do not smoke or use any products that contain nicotine or tobacco. If you need help quitting, ask your doctor. General instructions Take over-the-counter and prescription medicines only as told by your doctor. If you were given a machine to use while you sleep, use it only as told by your doctor. If you are having surgery, make sure to tell  your doctor you have sleep apnea. You may need to bring your device with you. Keep all follow-up visits. Contact a doctor if: The machine that you were given to use during sleep bothers you or does not seem to be working. You do not get better. You get worse. Get help right away if: Your chest hurts. You have trouble breathing in enough  air. You have an uncomfortable feeling in your back, arms, or stomach. You have trouble talking. One side of your body feels weak. A part of your face is hanging down. These symptoms may be an emergency. Get help right away. Call your local emergency services (911 in the [place] Do not wait to see if the symptoms will go away. Do not drive yourself to the hospital. Summary This condition affects breathing during sleep. The most common cause is a collapsed or blocked airway. The goal of treatment is to help you breathe normally while you sleep. This information is not intended to replace advice given to you by your health care provider. Make sure you discuss any questions you have with your health care provider. Document Revised: 09/20/2020 Document Reviewed: 01/21/2020 Elsevier Patient Education  2024 [employer].

## 2023-03-10 NOTE — Progress Notes (Signed)
 @Patient  ID: Dawn [name], female    DOB: January 14, 1961, 63 y.o.   MRN: [id number]  No chief complaint on file.   Referring provider: Cleatus Arlyss RAMAN, MD  HPI: 63 year old female, never smoked. PMH significant for OSA and insomnia. Patient of Dr. Neysa, last seen on 05/08/21.   HST 11/17/19- AHI 28/ hr, desaturation to 67% with mean 88%. CPAP titration 01/09/20- 10 cwp with mean O2 sat 91%.   03/10/2023- Interim hx  Patient presents today for OV follow-up. During last visit she was referred to dentistry for oral appliance for treatment of OSA. She takes clonazepam0.5mg  twice daily as needed for anxiety and Ambien  5mg  at bedtime for insomnia.   Discussed the use of AI scribe software for clinical note transcription with the patient, who gave verbal consent to proceed.  History of Present Illness   The patient, with a known history of sleep apnea, was previously referred for an oral appliance by Dr. Neysa. She has been using the oral appliance for the past year and a half. A repeat sleep study with [facility] was conducted in the fall, which indicated that while the appliance was functioning as expected, the patient's oxygen levels were still dipping during the night. As a result, it was recommended that the patient resume using a CPAP machine.  The patient reported no noticeable benefits from the oral appliance in terms of sleep quality or energy levels. She did, however, note an absence of snoring. The patient expressed a willingness to add supplemental oxygen to her treatment regimen if it would improve her condition.  The patient also reported disrupted sleep, waking up every two hours during the night. She has been taking Ambien  5mg  to aid sleep. The patient recently retired and is adjusting to a new routine, which may be affecting her sleep quality.  The patient previously tried using a CPAP machine but found it uncomfortable and difficult to adjust to. She expressed a  preference for a different style of CPAP mask if she were to resume using one.  The patient's sleep apnea was classified as moderate to severe prior to the use of the oral appliance. The patient is open to exploring different treatment options, including the addition of supplemental oxygen to oral appliance or switching to the [place] device.           Allergies  Allergen Reactions   Augmentin [Amoxicillin-Pot Clavulanate]     Severe yeast infection   Levonorgestrel-Eth Estradiol [Levonorgestrel-Ethinyl Estrad] Other (See Comments)    Other reaction(s): eye redness, headache, lightheadedness    Immunization History  Administered Date(s) Administered   Influenza,inj,Quad PF,6+ Mos 11/27/2017, 12/01/2019, 12/11/2020, 11/26/2021   Influenza,inj,quad, With Preservative 11/20/2013   Influenza-Unspecified 11/04/2022   MMR 02/25/1962   PFIZER(Purple Top)SARS-COV-2 Vaccination 05/10/2019, 05/31/2019, 01/28/2020   Tdap 10/07/2019   Varicella 02/25/1962   Zoster Recombinant(Shingrix) 03/27/2021, 05/26/2021    Past Medical History:  Diagnosis Date   Allergy    Anemia    Anxiety    Arthritis    GERD (gastroesophageal reflux disease)    H/O hiatal hernia    HTN (hypertension)    Insomnia    OSA (obstructive sleep apnea)    Sleep apnea    Urinary incontinence    Uterine polyp    s/p resection    Tobacco History: Social History   Tobacco Use  Smoking Status Never  Smokeless Tobacco Never   Counseling given: Not Answered   Outpatient Medications Prior to Visit  Medication Sig Dispense Refill   cetirizine  (ZYRTEC  ALLERGY) 10 MG tablet Take 1 tablet (10 mg total) by mouth daily for 14 days. 14 tablet 0   clonazePAM  (KLONOPIN ) 0.5 MG tablet TAKE ONE TABLET TWICE DAILY AS NEEDED FOR ANXIETY 10 tablet 2   esomeprazole  (NEXIUM ) 20 MG capsule Take 1 capsule (20 mg total) by mouth daily at 12 noon. (Patient taking differently: Take 40 mg by mouth daily at 12 noon. Takes 40)      fluocinonide  cream (LIDEX ) 0.05 % Apply 1 Application topically 2 (two) times daily as needed. 30 g 0   fluticasone  (FLONASE ) 50 MCG/ACT nasal spray Place 2 sprays into both nostrils daily. 16 g 6   ibuprofen (ADVIL,MOTRIN) 200 MG tablet Take 200 mg by mouth every 6 (six) hours as needed.     lisinopril -hydrochlorothiazide  (ZESTORETIC ) 20-25 MG tablet TAKE ONE TABLET BY MOUTH DAILY 90 tablet 1   venlafaxine  XR (EFFEXOR -XR) 150 MG 24 hr capsule TAKE ONE CAPSULE BY MOUTH ONCE DAILY WITH BREAKFAST 90 capsule 3   zolpidem  (AMBIEN ) 10 MG tablet TAKE 1/2 TABLET BY MOUTH AT BEDTIME 30 tablet 2   No facility-administered medications prior to visit.   Review of Systems  Review of Systems  Constitutional: Negative.   Respiratory: Negative.    Cardiovascular: Negative.   Psychiatric/Behavioral:  Positive for sleep disturbance.    Physical Exam  LMP 12/03/2016  Physical Exam Constitutional:      Appearance: Normal appearance.  HENT:     Head: Normocephalic and atraumatic.     Mouth/Throat:     Mouth: Mucous membranes are moist.     Pharynx: Oropharynx is clear.  Cardiovascular:     Rate and Rhythm: Normal rate and regular rhythm.  Pulmonary:     Effort: Pulmonary effort is normal.     Breath sounds: Normal breath sounds.  Skin:    General: Skin is warm and dry.  Neurological:     General: No focal deficit present.     Mental Status: She is alert and oriented to person, place, and time. Mental status is at baseline.  Psychiatric:        Mood and Affect: Mood normal.        Behavior: Behavior normal.        Thought Content: Thought content normal.        Judgment: Judgment normal.      Lab Results:  CBC    Component Value Date/Time   WBC 4.9 08/05/2019 0000   RBC 4.71 11/06/2017 1011   HGB 14.5 09/25/2021 0000   HGB 15.2 08/05/2019 0000   HCT 41.1 11/06/2017 1011   HCT 33 03/05/2017 0000   PLT 224 09/25/2021 0000   PLT 246 08/05/2019 0000   MCV 87.2 11/06/2017 1011   MCV  71 03/05/2017 0000   MCHC 33.8 11/06/2017 1011   RDW 14.7 11/06/2017 1011   LYMPHSABS 1.8 11/06/2017 1011   MONOABS 0.4 11/06/2017 1011   EOSABS 0.1 11/06/2017 1011   BASOSABS 0.0 11/06/2017 1011    BMET    Component Value Date/Time   NA 140 08/25/2017 1716   K 3.5 09/25/2021 0000   K 3.8 03/05/2017 0000   CL 100 08/25/2017 1716   CO2 31 08/25/2017 1716   GLUCOSE 86 08/25/2017 1716   BUN 14 08/25/2017 1716   BUN 14 06/19/2016 0000   CREATININE 1.0 09/25/2021 0000   CREATININE 1.02 08/05/2019 0000   CALCIUM 9.4 08/25/2017 1716   GFRNONAA >60 12/10/2006  1315   GFRAA  12/10/2006 1315    >60        The eGFR has been calculated using the MDRD equation. This calculation has not been validated in all clinical    BNP No results found for: BNP  ProBNP No results found for: PROBNP  Imaging: No results found.   Assessment & Plan:   1. OSA (obstructive sleep apnea) (Primary)  2. Insomnia, unspecified type      Obstructive Sleep Apnea Moderate to severe sleep apnea with severe oxygen desaturations. Patient has been using an oral appliance for the past year and a half, but a recent sleep study showed persistent nocturnal hypoxia. Patient reports no improvement in sleep quality or energy levels with the oral appliance. Patient previously tried CPAP but had difficulty tolerating it. -Discuss with Dr. Neysa about the need for a repeat sleep study, either at home or in the lab. -Consider adding supplemental oxygen to the oral appliance if it was effective at decreasing the apneas. -Consider re-initiating CPAP therapy with a different mask design where tubing comes out of the top of the head vs front of the mask. -Provide patient with information about the Inspire device as a potential treatment option. -Follow-up with patient regarding the plan after discussing with Dr. Neysa.  Insomnia Patient reports taking Ambien  5mg  at night to help with sleep. Despite this, patient  reports waking up every 2 hours during the night. -Continue Ambien  5mg  at night as currently prescribed. -Consider further evaluation or treatment options for insomnia if sleep quality does not improve with the management of sleep apnea.      Almarie LELON Ferrari, NP 03/10/2023

## 2023-03-11 NOTE — Addendum Note (Signed)
 Addended by: Glenford Bayley on: 03/11/2023 02:26 PM   Modules accepted: Orders

## 2023-03-11 NOTE — Telephone Encounter (Signed)
 Sleep study showed patient spent 51 mins with SpO2 <88% with oral device. Patient still had moderate amount of apneic events, she does need CPAP. Please let patient know that I will order split night sleep study

## 2023-03-11 NOTE — Telephone Encounter (Signed)
 Nfn needed for this

## 2023-03-14 NOTE — Telephone Encounter (Addendum)
Left message on VM for patient to call clinic to let her know Dawn [name] has ordered a split night sleep study and that she does need CPAP therapy.  Will also send MyChart message.

## 2023-03-20 ENCOUNTER — Telehealth: Payer: Self-pay | Admitting: Primary Care

## 2023-03-20 DIAGNOSIS — G4733 Obstructive sleep apnea (adult) (pediatric): Secondary | ICD-10-CM

## 2023-03-20 NOTE — Telephone Encounter (Signed)
Sleep study denied by [employer]   This letter is to notify you that [employer] [employer] will not cover the service requested because it does not meet medical necessity criteria. Below is the clinical rationale for our denial. The specific service(s) denied are detailed on the following page. Clinical detail and Criteria or guideline referenced: Your doctor told us that your breathing stops and starts over and over during sleep. A sleep test in the lab was asked for. We cannot approve this request because: A test similar to the one requested has already been performed. The results of that study or test showed your doctor what they needed to see in order to treat your condition. No additional testing is necessary at this time. A repeat sleep study can be done for any one of the following reasons. -Your body mass index (BMI) has dropped by 10 percent with a desire to stop positive airway pressure (PAP) treatment or you are not able to tolerate the PAP device. BMI is a number that measures body size based on height and weight. The PAP device helps to keep your airway open during sleep. -Your BMI has gone up by 10 percent and the results of a new study would lead to a major change in treatment.-Results of prior sleep testing did not show enough data and were unable to confirm a sleep problem. -One of the others listed in this guideline. One of the following must be met. -Results of an apnea/hypopnea index must be at least five per hour with ongoing symptoms of obstructive sleep apnea (OSA). OSA is when you stop and start breathing over and over during sleep. -Symptoms persist despite the use of automatic positive airway pressure (APAP). APAP must be used at least four hours per night for 70 percent of the nights during a 30 day trial. APAP is a device that helps to keep your airway open during sleep. Repeat sleep testing is not supported when a new positive airway pressure (PAP) device is needed. A PAP device  helps keep your airway open while sleeping. The notes sent in show that the repeat test was requested for this reason. Repeat sleep testing is not supported once the source of your problem has been found (diagnosed).  P2P can be scheduled through 03/28/23

## 2023-03-24 NOTE — Telephone Encounter (Signed)
  Patient had sleep study done through her dentist while wearing oral appliance and was still noted to have apneic events along with oxygen desaturations.  Cancel split night sleep study; Ordering CPAP titration study.

## 2023-03-25 NOTE — Telephone Encounter (Signed)
I can submit this request but these studies have the same cpt code, I'm not sure if it will be approved.   Can we try polysomnography? (Different cpt code)

## 2023-03-25 NOTE — Telephone Encounter (Signed)
I need a CPAP titration study, please re-submit

## 2023-03-31 NOTE — Telephone Encounter (Signed)
Case ID#: [id number]  Denied by Magnus Ivan  Provider agreed to complete P2P   P2P has been scheduled for Tomorrow (2/4) at [facility]   Clinical reviewer will be calling [phone]  Will call and inform pt to ignore appt made on chart for this time.   Thanks

## 2023-04-01 ENCOUNTER — Ambulatory Visit: Payer: 59 | Admitting: Primary Care

## 2023-04-01 NOTE — Telephone Encounter (Signed)
PT scheduled   P2P completed and approved, Left pt a vm about sleep study appt

## 2023-04-01 NOTE — Progress Notes (Signed)
 HST 11/17/19- AHI 28/ hr, desaturation to 67% with mean 88%. CPAP titration 01/09/20- 10 cwp with mean O2 sat 91%.   Home sleep study in October 2024 with [facility] showed patient spent 51 mins with SpO2 <88% while wearing oral device. Patient still had moderate amount of apneic events, AHI 21/hour. Needs to resume CPAP, patient agreeing to re-try. Does not need diagnostic since weight has remained stable. Failed auto CPAP in the past. Repeat CPAP titration approved.   Auth code for CPAP titration study - [id number] Feb 4- May 5th

## 2023-04-24 ENCOUNTER — Ambulatory Visit: Payer: 59 | Admitting: Family Medicine

## 2023-04-24 VITALS — BP 128/74 | HR 95 | Temp 98.8°F | Ht 67.0 in | Wt 253.6 lb

## 2023-04-24 DIAGNOSIS — M25569 Pain in unspecified knee: Secondary | ICD-10-CM

## 2023-04-24 MED ORDER — ESOMEPRAZOLE MAGNESIUM 20 MG PO CPDR: 40.0000 mg | DELAYED_RELEASE_CAPSULE | Freq: Every day | ORAL | Status: DC

## 2023-04-24 MED ORDER — MELOXICAM 7.5 MG PO TABS: 7.5000 mg | ORAL_TABLET | Freq: Every day | ORAL | 1 refills | Status: DC

## 2023-04-24 MED ORDER — ESOMEPRAZOLE MAGNESIUM 20 MG PO CPDR: 40.0000 mg | DELAYED_RELEASE_CAPSULE | Freq: Every day | ORAL | Status: AC

## 2023-04-24 NOTE — Progress Notes (Signed)
 L knee pain.  She thought she had strained it.  Tylenol and ibuprofen didn't help.  She was on leg press machine last week. No pop or snap.  No R knee sx other than some long standing crepitus.  No redness on L knee but it was puffy- that is some better now.  Lateral and posterior L knee pain.   Meds, vitals, and allergies reviewed.   ROS: Per HPI unless specifically indicated in ROS section   Nad Ncat L knee puffy lateral to the quad ligament. No erythema.  ROM intact Able to bear weight.  Not ttp on the joint line o/w.  ACL MCL LDL still solid.

## 2023-04-24 NOTE — Patient Instructions (Signed)
 Ice, knee sleeve, meloxicam as needed, limit squats or similar for now.  Take care.  Glad to see you.

## 2023-04-27 DIAGNOSIS — M25569 Pain in unspecified knee: Secondary | ICD-10-CM | POA: Insufficient documentation

## 2023-04-27 NOTE — Assessment & Plan Note (Signed)
 Likely strain vs OA flare.  D/w pt.  Ice, knee sleeve, meloxicam as needed, limit squats or similar for now. Update me as needed.

## 2023-04-28 ENCOUNTER — Other Ambulatory Visit (HOSPITAL_COMMUNITY)
Admission: RE | Admit: 2023-04-28 | Discharge: 2023-04-28 | Disposition: A | Discharge status: Home / Self Care | Source: Ambulatory Visit | Attending: Obstetrics & Gynecology | Admitting: Obstetrics & Gynecology

## 2023-04-28 ENCOUNTER — Encounter: Payer: Self-pay | Admitting: Obstetrics & Gynecology

## 2023-04-28 ENCOUNTER — Ambulatory Visit (INDEPENDENT_AMBULATORY_CARE_PROVIDER_SITE_OTHER): Payer: 59 | Admitting: Obstetrics & Gynecology

## 2023-04-28 VITALS — BP 133/86 | HR 93 | Ht 67.0 in | Wt 256.0 lb

## 2023-04-28 DIAGNOSIS — Z1231 Encounter for screening mammogram for malignant neoplasm of breast: Secondary | ICD-10-CM | POA: Diagnosis not present

## 2023-04-28 DIAGNOSIS — Z01419 Encounter for gynecological examination (general) (routine) without abnormal findings: Secondary | ICD-10-CM

## 2023-04-28 NOTE — Progress Notes (Signed)
 GYNECOLOGY ANNUAL PREVENTATIVE CARE ENCOUNTER NOTE  History:     Dawn [name] is a 63 y.o. [id number] female here for a routine annual gynecologic exam and to establish care.  Was a patient for [facility], she witched to here due to insurance reasons. Using  estradiol cream for vulvovaginal atrophy, no other concerns.  Does not need refills.   Denies abnormal vaginal bleeding, discharge, pelvic pain, problems with intercourse or other gynecologic concerns.    Gynecologic History Patient's last menstrual period was 12/03/2016. Contraception: post menopausal status Last Pap: Unsure of date. Result was normal with negative HPV Last Mammogram: 02/2022 at [facility]  Result was normal Last Colonoscopy: 12/16/2022.  Result was normal  Obstetric History OB History  Gravida Para Term Preterm AB Living  1    1 1   SAB IAB Ectopic Multiple Live Births  1        # Outcome Date GA Lbr Len/2nd Weight Sex Type Anes PTL Lv  1 SAB             Past Medical History:  Diagnosis Date   Allergy    Anemia    Anxiety    Arthritis    GERD (gastroesophageal reflux disease)    H/O hiatal hernia    HTN (hypertension)    Insomnia    OSA (obstructive sleep apnea)    Sleep apnea    Urinary incontinence    Uterine polyp    s/p resection    Past Surgical History:  Procedure Laterality Date   CHOLECYSTECTOMY     CMC surgery Left    Base of thumg   COLONOSCOPY  06/03/2017   TONSILLECTOMY     URETHRAL SLING     VAGINAL DELIVERY      Current Outpatient Medications on File Prior to Visit  Medication Sig Dispense Refill   clonazePAM (KLONOPIN) 0.5 MG tablet TAKE ONE TABLET TWICE DAILY AS NEEDED FOR ANXIETY 10 tablet 2   esomeprazole (NEXIUM) 20 MG capsule Take 2 capsules (40 mg total) by mouth daily.     estradiol (ESTRACE) 2 MG tablet Take 2 mg by mouth daily.     fluticasone (FLONASE) 50 MCG/ACT nasal spray Place 2 sprays into both nostrils daily. 16 g 6    lisinopril-hydrochlorothiazide (ZESTORETIC) 20-25 MG tablet TAKE ONE TABLET BY MOUTH DAILY 90 tablet 1   meloxicam (MOBIC) 7.5 MG tablet Take 1 tablet (7.5 mg total) by mouth daily. Don't take with ibuprofen or aleve. 30 tablet 1   venlafaxine XR (EFFEXOR-XR) 150 MG 24 hr capsule TAKE ONE CAPSULE BY MOUTH ONCE DAILY WITH BREAKFAST 90 capsule 3   zolpidem (AMBIEN) 10 MG tablet TAKE 1/2 TABLET BY MOUTH AT BEDTIME 30 tablet 2   fluocinonide cream (LIDEX) 0.05 % Apply 1 Application topically 2 (two) times daily as needed. (Patient not taking: Reported on 04/28/2023) 30 g 0   No current facility-administered medications on file prior to visit.    Allergies  Allergen Reactions   Augmentin [Amoxicillin-Pot Clavulanate]     Severe yeast infection   Levonorgestrel-Eth Estradiol [Levonorgestrel-Ethinyl Estrad] Other (See Comments)    Other reaction(s): eye redness, headache, lightheadedness    Social History:  reports that she has never smoked. She has never used smokeless tobacco. She reports current alcohol use. She reports that she does not use drugs.  Family History  Problem Relation Age of Onset   Arrhythmia Mother    Diabetes Mother    Memory loss  Mother    Colon polyps Mother    Heart attack Father    Heart disease Father    Diabetes Father    Kidney disease Father    Colon polyps Father    Diverticulitis Father        also small bowel blockages with colectomy x 2    Coronary artery disease Father    Colon polyps Sister    Breast cancer Paternal Grandmother    Esophageal cancer Maternal Aunt    Colon cancer Neg Hx    Rectal cancer Neg Hx    Stomach cancer Neg Hx     The following portions of the patient's history were reviewed and updated as appropriate: allergies, current medications, past family history, past medical history, past social history, past surgical history and problem list.  Review of Systems Pertinent items noted in HPI and remainder of comprehensive ROS  otherwise negative.  Physical Exam:  BP 133/86   Pulse 93   Ht 5\' 7"  (1.702 m)   Wt 256 lb (116.1 kg)   LMP 12/03/2016   BMI 40.10 kg/m  CONSTITUTIONAL: Well-developed, well-nourished female in no acute distress.  HENT:  Normocephalic, atraumatic, External right and left ear normal.  EYES: Conjunctivae and EOM are normal. Pupils are equal, round, and reactive to light. No scleral icterus.  NECK: Normal range of motion, supple, no masses.  Normal thyroid.  SKIN: Skin is warm and dry. No rash noted. Not diaphoretic. No erythema. No pallor. MUSCULOSKELETAL: Normal range of motion. No tenderness.  No cyanosis, clubbing, or edema. NEUROLOGIC: Alert and oriented to person, place, and time. Normal reflexes, muscle tone coordination.  PSYCHIATRIC: Normal mood and affect. Normal behavior. Normal judgment and thought content. CARDIOVASCULAR: Normal heart rate noted, regular rhythm RESPIRATORY: Clear to auscultation bilaterally. Effort and breath sounds normal, no problems with respiration noted. BREASTS: Symmetric in size. No masses, tenderness, skin changes, nipple drainage, or lymphadenopathy bilaterally. Performed in the presence of a chaperone. ABDOMEN: Soft, no distention noted.  No tenderness, rebound or guarding.  PELVIC: Normal appearing external genitalia and urethral meatus with moderate atrophy noted; atrophic appearing vaginal mucosa and cervix.  No abnormal vaginal discharge noted.  Pap smear obtained.  Normal uterine size, no other palpable masses, no uterine or adnexal tenderness.  Performed in the presence of a chaperone.   Assessment and Plan:     1. Encounter for screening mammogram for breast cancer Mammogram to be scheduled for breast cancer screening, patient to confirm location covered by her insurance - MM 3D SCREENING MAMMOGRAM BILATERAL BREAST; Future  2. Well woman exam with routine gynecological exam (Primary) - Cytology - PAP Will follow up results of pap smear and  manage accordingly. Colon cancer screening is up to date. Routine preventative health maintenance measures emphasized. Please refer to After Visit Summary for other counseling recommendations.      Jaynie Collins, MD, FACOG Obstetrician & Gynecologist, [place] [employer], [facility]

## 2023-05-01 ENCOUNTER — Encounter: Payer: Self-pay | Admitting: Obstetrics & Gynecology

## 2023-05-01 LAB — CYTOLOGY - PAP
Comment: NEGATIVE
Diagnosis: NEGATIVE
High risk HPV: NEGATIVE

## 2023-05-10 ENCOUNTER — Encounter: Payer: Self-pay | Admitting: Family Medicine

## 2023-05-14 ENCOUNTER — Other Ambulatory Visit: Payer: Self-pay | Admitting: Family Medicine

## 2023-05-14 DIAGNOSIS — M25569 Pain in unspecified knee: Secondary | ICD-10-CM

## 2023-05-16 ENCOUNTER — Telehealth: Payer: Self-pay | Admitting: Family Medicine

## 2023-05-16 NOTE — Telephone Encounter (Signed)
 Copied from CRM [id number]. Topic: General - Other >> May 16, 2023 10:35 AM Deaijah H wrote: Reason for CRM: Patient called in due to Orthopedic she was referred to can't see her until April 10th would like to know Dr. Para March opinion on patient seeing Dr. Patsy Lager in office due to him being sport medicine. Please call [phone]

## 2023-05-18 NOTE — Telephone Encounter (Signed)
 Seeing Dr. Patsy Lager is a great option.  Please see if he has spots on the schedule so she can get scheduled in the meantime.  Thanks.

## 2023-05-19 ENCOUNTER — Ambulatory Visit (HOSPITAL_BASED_OUTPATIENT_CLINIC_OR_DEPARTMENT_OTHER): Payer: 59 | Attending: Primary Care | Admitting: Internal Medicine

## 2023-05-19 VITALS — Ht 67.0 in | Wt 262.0 lb

## 2023-05-19 DIAGNOSIS — G4733 Obstructive sleep apnea (adult) (pediatric): Secondary | ICD-10-CM | POA: Diagnosis present

## 2023-05-19 DIAGNOSIS — R0902 Hypoxemia: Secondary | ICD-10-CM | POA: Insufficient documentation

## 2023-05-19 NOTE — Telephone Encounter (Signed)
 Lvm to schedule for Knee pain with Dr Patsy Lager

## 2023-05-19 NOTE — Telephone Encounter (Signed)
 Please contact patient to schedule her to see. Dr. Patsy Lager way she waits to see orthopedic for knee pain. Thank you.

## 2023-05-20 NOTE — Telephone Encounter (Signed)
 [email or web address]

## 2023-05-20 NOTE — Progress Notes (Unsigned)
   [name] [name], MD, CAQ Sports Medicine [facility] [address] [place], [address]  Phone: [phone]  FAX: [phone]  Dawn [name] - 63 y.o. female  MRN [id number]  Date of Birth: 03-02-1960  Date: 05/21/2023  PCP: Joaquim Nam, MD  Referral: Joaquim Nam, MD  No chief complaint on file.  Subjective:   Dawn [name] is a 63 y.o. very pleasant female patient with There is no height or weight on file to calculate BMI. who presents with the following:  Patient presents with some ongoing knee pain and swelling.  She did see Dr. Para March about a month ago, and he did place her on some meloxicam.    Review of Systems is noted in the HPI, as appropriate  Objective:   LMP 12/03/2016   GEN: No acute distress; alert,appropriate. PULM: Breathing comfortably in no respiratory distress PSYCH: Normally interactive.   Laboratory and Imaging Data:  Assessment and Plan:   ***

## 2023-05-21 ENCOUNTER — Ambulatory Visit
Admission: RE | Admit: 2023-05-21 | Discharge: 2023-05-21 | Disposition: A | Discharge status: Home / Self Care | Source: Ambulatory Visit | Attending: Family Medicine | Admitting: Family Medicine

## 2023-05-21 ENCOUNTER — Encounter: Payer: Self-pay | Admitting: Family Medicine

## 2023-05-21 ENCOUNTER — Ambulatory Visit: Admitting: Family Medicine

## 2023-05-21 VITALS — BP 118/80 | HR 54 | Temp 97.4°F | Ht 67.0 in | Wt 257.0 lb

## 2023-05-21 DIAGNOSIS — M25562 Pain in left knee: Secondary | ICD-10-CM

## 2023-05-21 DIAGNOSIS — M1712 Unilateral primary osteoarthritis, left knee: Secondary | ICD-10-CM

## 2023-05-21 NOTE — Patient Instructions (Signed)
 Voltaren 1% gel, over the counter ?You can apply up to 4 times a day ? ?This can be applied to any joint: knee, wrist, fingers, elbows, shoulders, feet and ankles. ?Can apply to any tendon: tennis elbow, achilles, tendon, rotator cuff or any other tendon. ? ?Minimal is absorbed in the bloodstream: ok with oral anti-inflammatory or a blood thinner. ? ?Cost is about 9 dollars  ?

## 2023-05-24 DIAGNOSIS — G4733 Obstructive sleep apnea (adult) (pediatric): Secondary | ICD-10-CM | POA: Diagnosis not present

## 2023-05-24 NOTE — Procedures (Signed)
 [facility] [address] [place], [place] [address] Tel: [phone]   Fax: [phone]  Titration Interpretation  Patient Name:  Dawn [name], Dawn [name] Date:  05/19/2023 Referring Physician:  Glenford Bayley, NP  Indications for Polysomnography OSA. Baseline HST 11/17/19   AHI 28/hr, desaturation to 67%, body weight 252 lbs The patient is a 63 year old Female who is 5\' 7"  and weighs 262.0 lbs. Her BMI equals 41.1.  A full night titration treatment study was performed.  Medication taken at 8:30 pm  Ambien   Polysomnogram Data A full night polysomnogram recorded the standard physiologic parameters including EEG, EOG, EMG, EKG, nasal and oral airflow.  Respiratory parameters of chest and abdominal movements were recorded with Respiratory Inductance Plethysmography belts.  Oxygen saturation was recorded by pulse oximetry.   Sleep Architecture The total recording time of the polysomnogram was 448.9 minutes.  The total sleep time was 309.5 minutes.  The patient spent 6.0% of total sleep time in Stage N1, 43.1% in Stage N2, 50.9% in Stages N3, and 0.0% in REM.  Sleep latency was 38.0 minutes.  REM latency was - minutes.  Sleep Efficiency was 68.9%.  Wake after Sleep Onset time was 101.0 minutes.  Titration Summary The patient was titrated at pressures ranging from 6* cm/H20 with supplemental oxygen at - up to 14* cm/H20 with supplemental oxygen at O2: 2.  The last pressure used in the study was 14* cm/H20 with supplemental oxygen at O2: 2.  Respiratory Events The polysomnogram revealed a presence of - obstructive, 1 central, and - mixed apneas resulting in an Apnea index of 0.2 events per hour.  There were 35 hypopneas (>=3% desaturation and/or arousal) resulting in an Apnea\Hypopnea Index (AHI >=3% desaturation and/or arousal) of 7.0 events per hour.  There were 11 hypopneas (>=4% desaturation) resulting in an Apnea\Hypopnea Index (AHI >=4%  desaturation) of 2.3 events per hour.  There were 7 Respiratory Effort Related Arousals resulting in a RERA index of 1.4 events per hour. The Respiratory Disturbance Index is 8.3 events per hour.  The snore index was 0.2 events per hour.  Mean oxygen saturation was 88.9%.  The lowest oxygen saturation during sleep was 84.0%.  Time spent <=88% oxygen saturation was 183.2 minutes (40.9%).  Limb Activity There were - limb movements recorded.  Of this total, - were classified as PLMs.  Of the PLMs, - were associated with arousals.  The Limb Movement index was - per hour while the PLM index was - per hour.  Cardiac Summary The average pulse rate was 69.1 bpm.  The minimum pulse rate was 58.0 bpm while the maximum pulse rate was 100.0 bpm.  Cardiac rhythm was normal.  Comment: Successful CPAP titration to 14 cwp with residual AHI (4%) 0/hr.  Supplemental O2 2L was added per protocol due to sustained oxygen saturation 88% or less, indicating nocturnal hypoxemia. On CPAP 14 with O2 2L, minimum O2 saturation was 88% with mean 89.9%.  Diagnosis: Obstructive sleep apnea, Nocturnal Hypoxemia  Recommendations: Suggest CPAP 14 or autopap 8-18, with supplemental O2 3L. Patient wore a medium AirFit F20 ResMed Full Face Mask with heated humidity and heated tubing.   This study was personally reviewed and electronically signed by: Jetty Duhamel Accredited Board Certified in Sleep Medicine Date/Time: 05/24/23  1:25 PM   Titration Report  Patient Name: Dawn [name], Dawn [name] Date: 05/19/2023  Date of Birth: 02/08/1961 Study Type: CPAP Titration  Age: 26 year MRN #: [id number]  Sex: Female Interpreting Physician: Jetty Duhamel [id number]  Height: 5\' 7"  Referring Physician: Glenford Bayley, Np  Weight: 262.0 lbs Recording Dawn [name]: Armen Pickup RPSGT RST  BMI: 41.1 Scoring Dawn [name]: Armen Pickup RPSGT RST  ESS: 2 Neck Size: 17  Mask Type Airfit F 20 Final Pressure: 14  Mask Size: Medium Supplemental O2: 2    Study Overview  Lights Off: 09:31:02 PM  Count Index  Lights On: 04:59:58 AM Awakenings: 22 4.3  Time in Bed: 448.9 min. Arousals: 56 10.9  Total Sleep Time: 309.5 min. AHI (>=3% Desat and/or Ar.): 36 7.0   Sleep Efficiency: 68.9% AHI (>=4% Desat): 12 2.3   Sleep Latency: 38.0 min. Limb Movements: - -  Wake After Sleep Onset: 101.0 min. Snore: 1 0.2  REM Latency from Sleep Onset: - min. Desaturations: 77 14.9     Minimum SpO2 TST: 84.0%    Sleep Architecture  % of Time in Bed Stages Time (mins) % Sleep Time  Wake 139.5   Stage N1 18.5 6.0%  Stage N2 133.5 43.1%  Stage N3 157.5 50.9%  REM 0.0 0.0%   Arousal Summary   NREM REM Sleep Index  Respiratory Arousals 11 - 11 2.1  PLM Arousals - - - -  Isolated Limb Movement Arousals - - - -  Snore Arousals - - - -  Spontaneous Arousals 45 - 45 8.7  Total 56 - 56 10.9   Limb Movement Summary   Count Index  Isolated Limb Movements - -  Periodic Limb Movements (PLMs) - -  Total Limb Movements - -  Respiratory Summary   By Sleep Stage By Body Position Total   NREM REM Supine Non-Supine   Time (min) 309.5 0.0 57.0 252.5 309.5         Obstructive Apnea - - - - -  Mixed Apnea - - - - -  Central Apnea 1 - 1 - 1  Total Apneas 1 - 1 - 1  Total Apnea Index 0.2 - 1.1 - 0.2         Hypopneas (>=3% Desat and/or Ar.) 35 - 10 25 35  AHI (>=3% Desat and/or Ar.) 7.0 - 11.6 5.9 7.0         Hypopneas (>=4% Desat) 11 - 2 9 11   AHI (>=4% Desat) 2.3 - 3.2 2.1 2.3          RERAs 7 - - 7 7  RERA Index 1.4 - - 1.7 1.4         RDI 8.3 - 11.6 7.6 8.3     Respiratory Event Durations   Apnea Hypopnea   NREM REM NREM REM  Average (seconds) 14.9 - 19.3 -  Maximum (seconds) 14.9 - 27.8 -    Oxygen Saturation Summary   Wake NREM REM TST TIB  Average SpO2 90.0% 88.5% - 88.5% 88.9%  Minimum SpO2 83.0% 84.0% - 84.0% 83.0%  Maximum SpO2 99.0% 94.0% - 94.0% 99.0%   Oxygen Saturation Distribution  Range (%) Time in range (min) Time  in range (%)  90.0 - 100.0 82.0 18.3%  80.0 - 90.0 365.9 81.6%  70.0 - 80.0 - -  60.0 - 70.0 - -  50.0 - 60.0 - -  0.0 - 50.0 - -  Time Spent <=88% SpO2  Range (%) Time in range (min) Time in range (%)  0.0 - 88.0 183.2 40.9%      Count Index  Desaturations 77 14.9    Cardiac Summary   Wake NREM  REM Sleep Total  Average Pulse Rate (BPM) 72.0 67.9 - 67.9 69.1  Minimum Pulse Rate (BPM) 59.0 58.0 - 58.0 58.0  Maximum Pulse Rate (BPM) 95.0 100.0 - 100.0 100.0   Pulse Rate Distribution:  Range (bpm) Time in range (min) Time in range (%)  0.0 - 40.0 - -  40.0 - 60.0 12.6 2.8%  60.0 - 80.0 408.6 91.1%  80.0 - 100.0 27.3 6.1%  100.0 - 120.0 - -  120.0 - 140.0 - -  140.0 - 200.0 - -   Titration Summary  PAP Device PAP Level O2 Level Time (min) Wake (min) NREM (min) REM (min) Sleep Eff% OA# CA# MA# Hyp# (>=3%) AHI (>=3%) Hyp# (>=4%) AHI (>=%4) RERA RDI SpO2 <=88% (min) Min SpO2 Mean SpO2 Ar. Index  CPAP 6 - 94.0 45.0 49.0 0.0 52.1% - - - 3 3.7 -  - -  3.7  48.0 84.0 86.6 11.0  CPAP 7 - 71.5 15.5 56.0 0.0 78.3% - - - 6 6.4 3  3.2 6  12.9  46.4 85.0 87.5 11.8  CPAP 9 - 40.5 16.5 24.0 0.0 59.3% - - - 10 25.0 4  10.0 -  25.0  7.8 86.0 88.9 25.0  CPAP 10 - 29.5 0.0 29.5 0.0 100.0% - - - 1 2.0 -  - -  2.0  28.6 85.0 87.3 2.0  CPAP 11 - 4.5 1.5 3.0 0.0 66.7% - - - - - -  - -  -  3.0 86.0 86.9 20.0  CPAP 11 O2: 1 102.0 45.5 56.5 0.0 55.4% - - - 4 4.2 2  2.1 -  4.2  15.4 87.0 89.2 13.8  CPAP 12 O2: 1 28.5 0.0 28.5 0.0 100.0% - - - 4 8.4 -  - -  8.4  3.9 88.0 89.3 2.1  CPAP 13 O2: 1 21.0 1.0 20.0 0.0 95.2% - 1 - 4 15.0 1  6.0 -  15.0  6.8 87.0 89.0 9.0  CPAP 13 O2: 2 36.0 11.0 25.0 0.0 69.4% - - - 3 7.2 1  2.4 1  9.6  0.0 89.0 91.4 12.0  CPAP 14 O2: 2 21.5 3.5 18.0 0.0 83.7% - - - - - -  - -  -  0.1 88.0 89.9 6.7    Hypnograms                           Comments  Pt was at sleep lab for OSA. A CPAP titration study was ordered. Pt was fitted with a medium  Airfit F 20 Resmed FFM.  CPAP pressure started at 6 cm h2o and titrated to CPAP pressure of 14 cm h2o with heated humidity and heated tubing.  Pt tolerated CPAP pressure well.  respiratory events were eliminated.  Snoring was eliminated.  PLMs were rare.  Pt took Ambien at 8:30pm.  Pt was placed on two liter of oxygen due to low desaturations (1 liter at 1:30 am and 2 liters at 4:02 am).  No restroom visits. -Dawn [name]                          [employer], Biomedical engineer of Sleep Medicine  ELECTRONICALLY SIGNED ON:  05/24/2023, 1:12 PM [facility] PH: [phone]   FX: [phone] ACCREDITED BY THE AMERICAN ACADEMY OF SLEEP MEDICINE

## 2023-05-25 IMAGING — MG MM DIGITAL DIAGNOSTIC UNILAT*R* W/ TOMO W/ CAD
4 series · 4 of 12 positions shown · non-contrast
Comparison: Previous exam(s).

CLINICAL DATA: Screening recall for a right breast asymmetry.

EXAM:
DIGITAL DIAGNOSTIC UNILATERAL RIGHT MAMMOGRAM WITH TOMOSYNTHESIS AND
CAD
TECHNIQUE: Right digital diagnostic mammography and breast tomosynthesis was
performed. The images were evaluated with computer-aided detection.

[R ML synth-2D]
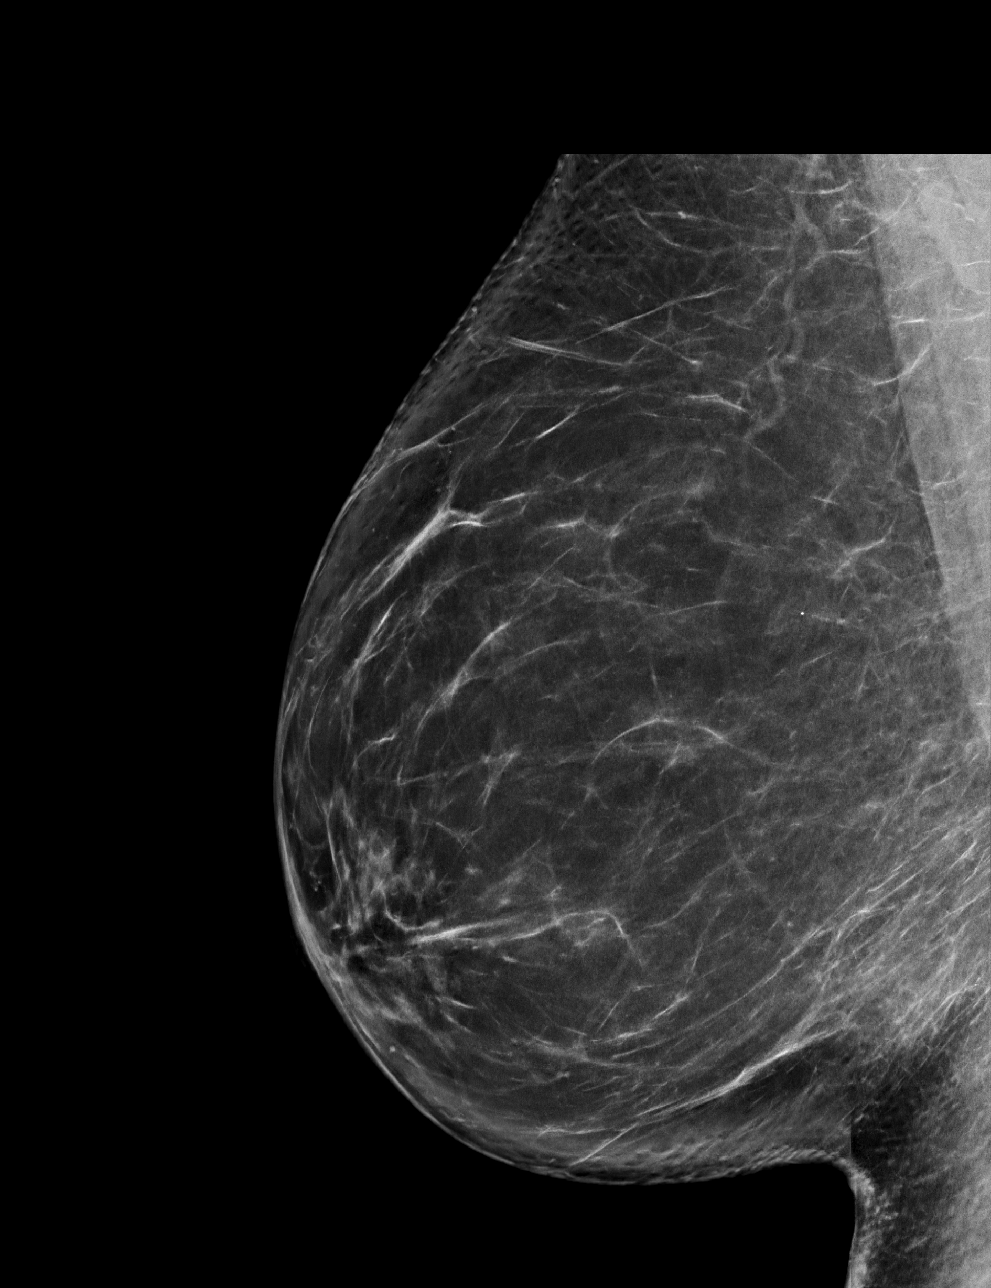

[R CC synth-2D]
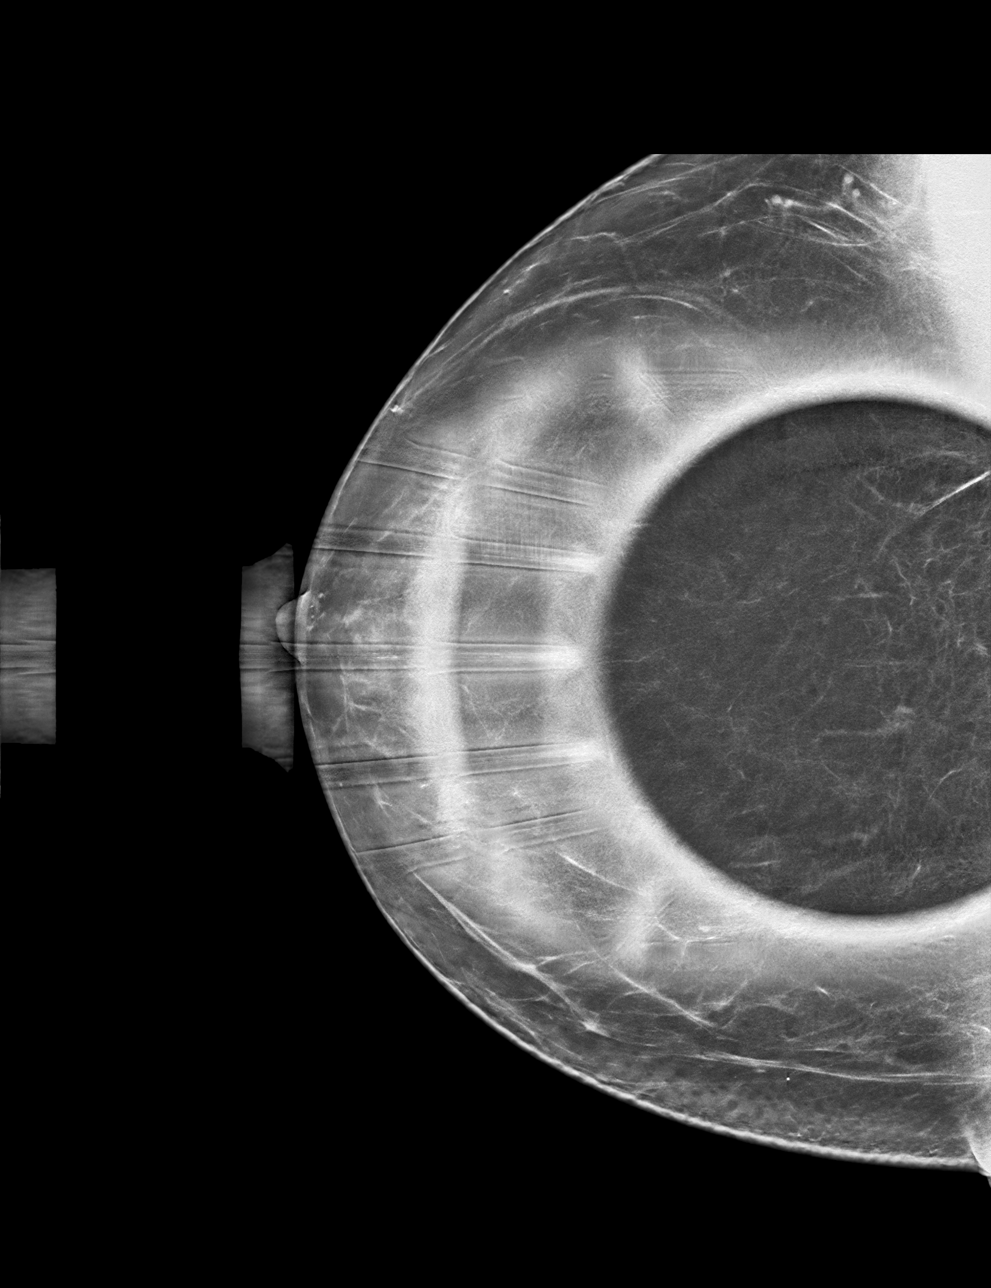

[R ML tomo · tomo slice 44/87.0]
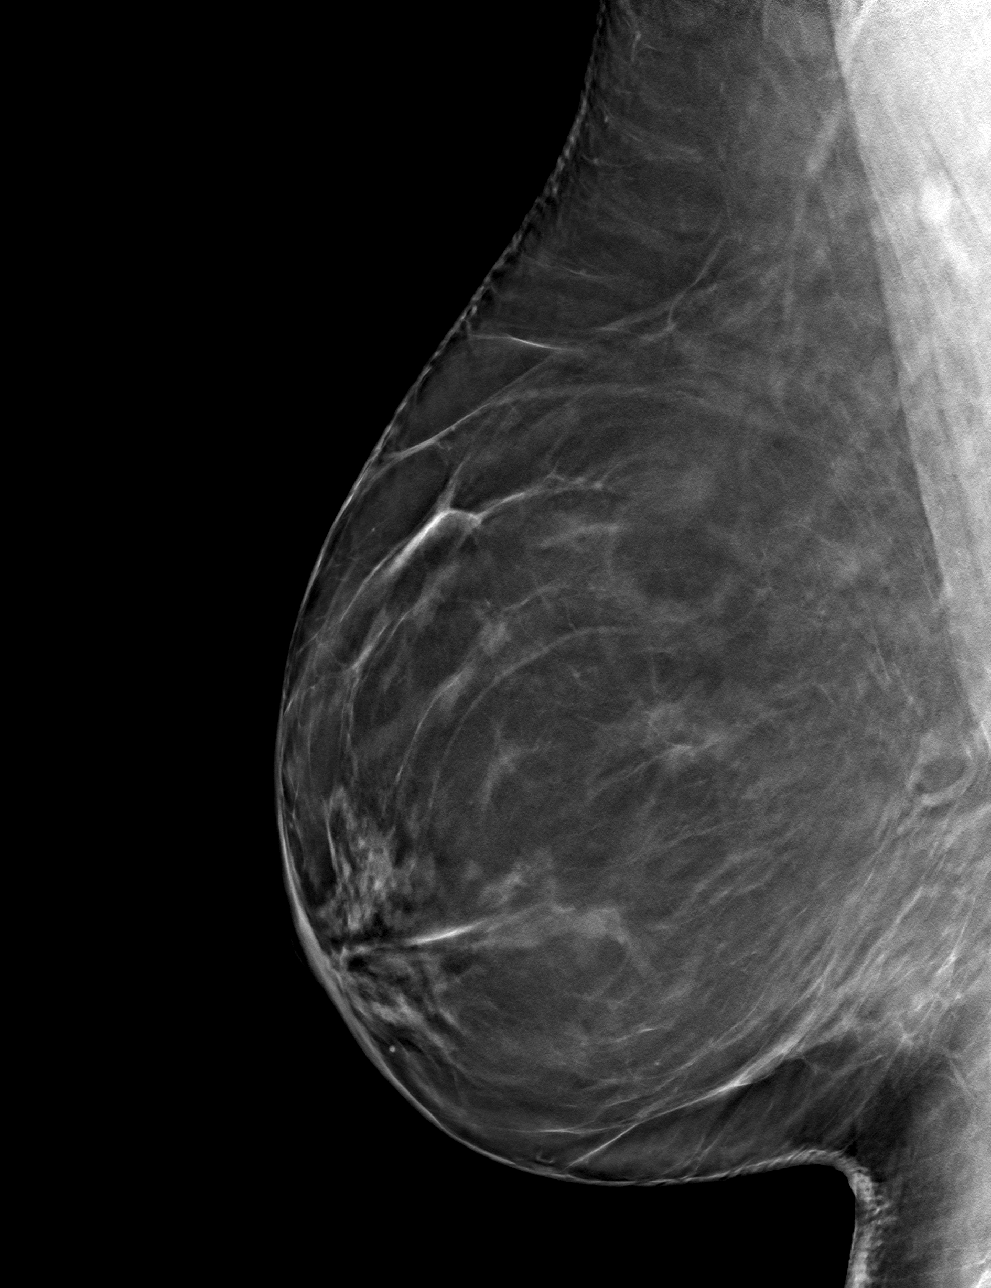

[R CC tomo · tomo slice 37/73.0]
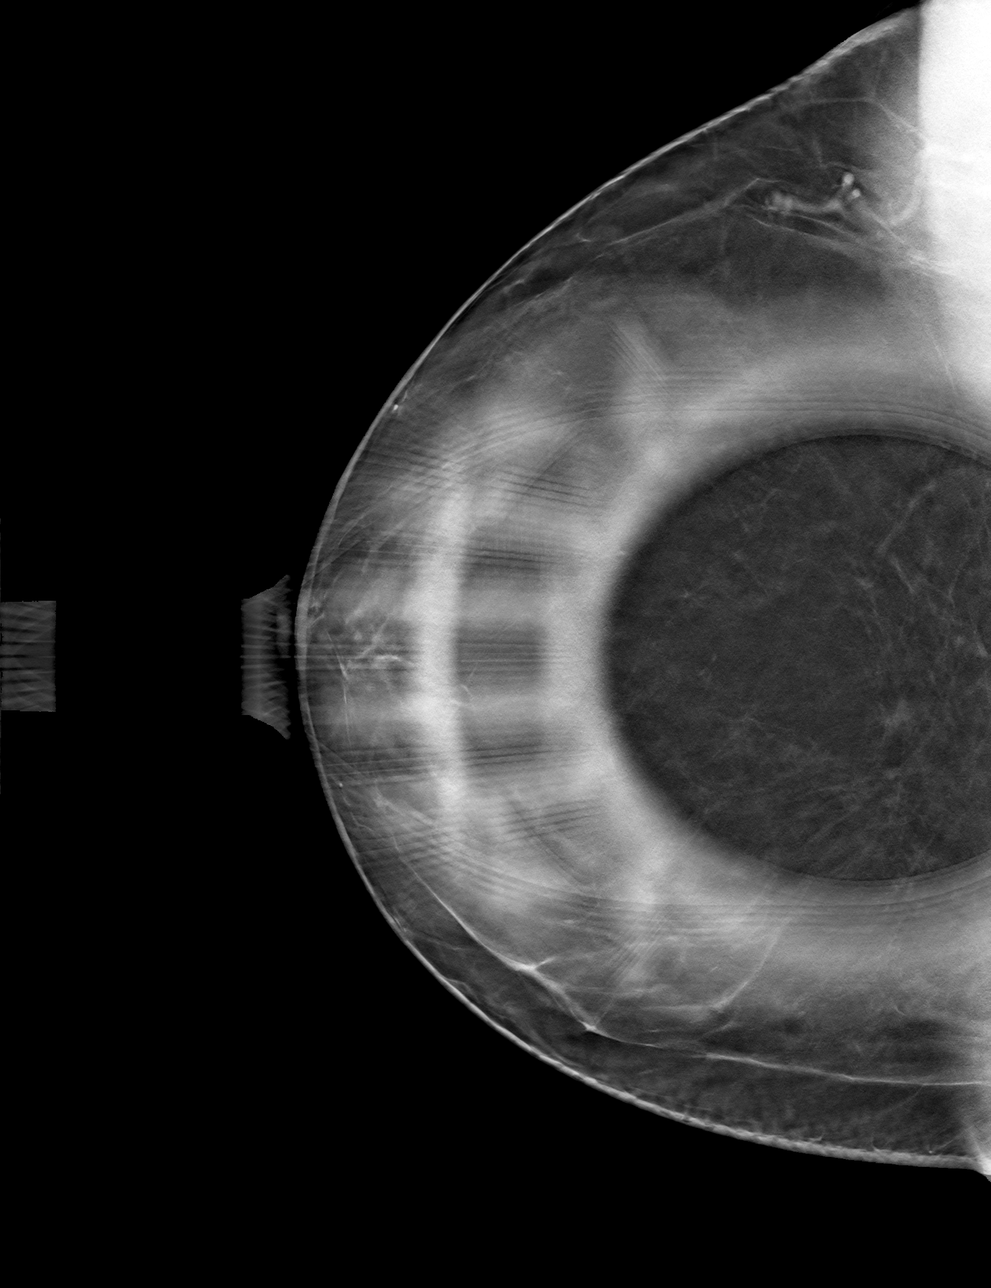

[4 of 12 positions shown; findings below may reference images not displayed]

ACR Breast Density Category b: There are scattered areas of
fibroglandular density.
FINDINGS: The 5 mm asymmetry in the central posterior right breast appear
stable compared to the patient's study in 3620. It is tubular and
curvilinear on the spot compression tomosynthesis images and may
represent a tortuous blood vessel. No suspicious features are
identified.
IMPRESSION: No suspicious persistent abnormalities in the right breast.

RECOMMENDATION:
Screening mammogram in one year.(Code:[id number]-[id number])

I have discussed the findings and recommendations with the patient.
If applicable, a reminder letter will be sent to the patient
regarding the next appointment.

BI-RADS CATEGORY  1: Negative.

## 2023-06-05 ENCOUNTER — Ambulatory Visit: Admitting: Orthopedic Surgery

## 2023-06-11 ENCOUNTER — Ambulatory Visit (HOSPITAL_BASED_OUTPATIENT_CLINIC_OR_DEPARTMENT_OTHER): Payer: 59 | Admitting: Primary Care

## 2023-06-11 ENCOUNTER — Encounter (HOSPITAL_BASED_OUTPATIENT_CLINIC_OR_DEPARTMENT_OTHER): Payer: Self-pay | Admitting: Primary Care

## 2023-06-11 VITALS — BP 132/84 | HR 100 | Ht 67.0 in | Wt 258.3 lb

## 2023-06-11 DIAGNOSIS — G4733 Obstructive sleep apnea (adult) (pediatric): Secondary | ICD-10-CM

## 2023-06-11 DIAGNOSIS — G4734 Idiopathic sleep related nonobstructive alveolar hypoventilation: Secondary | ICD-10-CM | POA: Diagnosis not present

## 2023-06-11 NOTE — Patient Instructions (Addendum)
 -  OBSTRUCTIVE SLEEP APNEA: Obstructive sleep apnea is a condition where your airway becomes blocked during sleep, causing breathing pauses. You will resume CPAP therapy with a pressure setting of 14 and 3 liters of oxygen at night. You should use the AirFit F20 ResMed full face mask. We will order an oxygen concentrator for you to use at night. Please contact [employer] to update your CPAP settings and register your machine.  INSTRUCTIONS: Resume CPAP at night with 3L oxygen  Follow-up FU 8-12 weeks for compliance check/ virtual Friday afternoon with [facility] NP

## 2023-06-11 NOTE — Progress Notes (Signed)
 @Patient  ID: Dawn [name], female    DOB: 12/25/1960, 63 y.o.   MRN: [id number]  Chief Complaint  Patient presents with   Follow-up    Obstructive sleep apnea- uses oral appliance    Referring provider: Donnie Galea, MD  HPI: 63 year old female, never smoked. PMH significant for HTN, OSA, nocturnal hypoxemia, GERD, OA, CAD, insomnia, lower back pain, anxiety.   06/11/2023- Interim hx  Discussed the use of AI scribe software for clinical note transcription with the patient, who gave verbal consent to proceed.  History of Present Illness   Dawn [name] is a 63 year old female with moderate to severe sleep apnea who presents for follow-up regarding CPAP therapy.  Initially diagnosed with moderate to severe sleep apnea in 2021, a home sleep study revealed 28 apneic events per hour with oxygen saturation dropping to 67%, averaging 88%. A CPAP titration study in November 2021 set her pressure at 10 cm H2O.  In January 2025, she was referred to dentistry for an oral appliance. Despite using the appliance, she did not notice any improvement in sleep quality or energy levels, though snoring was reduced. A repeat sleep study in October 2024 showed continued oxygen desaturation, leading to a recommendation to resume CPAP therapy.  In March 2025, she underwent a CPAP titration study at [facility], where she was fitted with an AirFit F20 ResMed full face mask. The CPAP pressure was titrated from 6 to 14 cm H2O with heated humidity, and she was placed on 2 liters of oxygen. She tolerated the CPAP well, with limited respiratory events and snoring. She took Ambien  at bedtime during the study.  She reports that her husband, who now has his own CPAP machine, was previously using her machine. She is currently using [employer] as her medical supply company for her CPAP needs.    Allergies  Allergen Reactions   Augmentin [Amoxicillin-Pot Clavulanate]     Severe yeast infection    Levonorgestrel-Eth Estradiol [Levonorgestrel-Ethinyl Estrad] Other (See Comments)    Other reaction(s): eye redness, headache, lightheadedness    Immunization History  Administered Date(s) Administered   Influenza Inj Mdck Quad With Preservative 12/04/2016, 12/02/2019   Influenza,inj,Quad PF,6+ Mos 11/27/2017, 12/01/2019, 12/11/2020, 11/26/2021   Influenza,inj,quad, With Preservative 11/20/2013   Influenza-Unspecified 11/04/2022   MMR [birth year]1962   PFIZER(Purple Top)SARS-COV-2 Vaccination 05/10/2019, 05/31/2019, 01/28/2020   Tdap 10/07/2019   Varicella 02/25/1962   Zoster Recombinant(Shingrix) 03/27/2021, 05/26/2021    Past Medical History:  Diagnosis Date   Allergy    Anemia    Anxiety    Arthritis    GERD (gastroesophageal reflux disease)    H/O hiatal hernia    HTN (hypertension)    Insomnia    OSA (obstructive sleep apnea)    Sleep apnea    Urinary incontinence    Uterine polyp    s/p resection    Tobacco History: Social History   Tobacco Use  Smoking Status Never  Smokeless Tobacco Never   Counseling given: Not Answered   Outpatient Medications Prior to Visit  Medication Sig Dispense Refill   clonazePAM  (KLONOPIN ) 0.5 MG tablet TAKE ONE TABLET TWICE DAILY AS NEEDED FOR ANXIETY 10 tablet 2   esomeprazole  (NEXIUM ) 20 MG capsule Take 2 capsules (40 mg total) by mouth daily.     fluocinonide  cream (LIDEX ) 0.05 % Apply 1 Application topically 2 (two) times daily as needed. 30 g 0   fluticasone  (FLONASE ) 50 MCG/ACT nasal spray Place 2 sprays into both nostrils daily.  16 g 6   lisinopril -hydrochlorothiazide  (ZESTORETIC ) 20-25 MG tablet TAKE ONE TABLET BY MOUTH DAILY 90 tablet 1   meloxicam  (MOBIC ) 7.5 MG tablet Take 1 tablet (7.5 mg total) by mouth daily. Don't take with ibuprofen or aleve. 30 tablet 1   venlafaxine  XR (EFFEXOR -XR) 150 MG 24 hr capsule TAKE ONE CAPSULE BY MOUTH ONCE DAILY WITH BREAKFAST 90 capsule 3   zolpidem  (AMBIEN ) 10 MG tablet TAKE 1/2 TABLET BY  MOUTH AT BEDTIME 30 tablet 2   No facility-administered medications prior to visit.   Review of Systems  Review of Systems  Constitutional: Negative.   HENT: Negative.    Respiratory: Negative.    Cardiovascular: Negative.   Psychiatric/Behavioral:  Positive for sleep disturbance.     Physical Exam  BP 132/84   Pulse 100   Ht 5\' 7"  (1.702 m)   Wt 258 lb 4.8 oz (117.2 kg)   LMP 12/03/2016   SpO2 95%   BMI 40.46 kg/m  Physical Exam Constitutional:      Appearance: Normal appearance.  HENT:     Head: Normocephalic and atraumatic.  Cardiovascular:     Rate and Rhythm: Normal rate and regular rhythm.  Pulmonary:     Effort: Pulmonary effort is normal.     Breath sounds: Normal breath sounds.  Musculoskeletal:        General: Normal range of motion.     Cervical back: Normal range of motion and neck supple.  Skin:    General: Skin is warm and dry.  Neurological:     General: No focal deficit present.     Mental Status: She is alert and oriented to person, place, and time. Mental status is at baseline.  Psychiatric:        Mood and Affect: Mood normal.        Behavior: Behavior normal.        Thought Content: Thought content normal.        Judgment: Judgment normal.      Lab Results:  CBC    Component Value Date/Time   WBC 4.9 08/05/2019 0000   RBC 4.71 11/06/2017 1011   HGB 14.5 09/25/2021 0000   HGB 15.2 08/05/2019 0000   HCT 41.1 11/06/2017 1011   HCT 33 03/05/2017 0000   PLT 224 09/25/2021 0000   PLT 246 08/05/2019 0000   MCV 87.2 11/06/2017 1011   MCV 71 03/05/2017 0000   MCHC 33.8 11/06/2017 1011   RDW 14.7 11/06/2017 1011   LYMPHSABS 1.8 11/06/2017 1011   MONOABS 0.4 11/06/2017 1011   EOSABS 0.1 11/06/2017 1011   BASOSABS 0.0 11/06/2017 1011    BMET    Component Value Date/Time   NA 140 08/25/2017 1716   K 3.5 09/25/2021 0000   K 3.8 03/05/2017 0000   CL 100 08/25/2017 1716   CO2 31 08/25/2017 1716   GLUCOSE 86 08/25/2017 1716   BUN 14  08/25/2017 1716   BUN 14 06/19/2016 0000   CREATININE 1.0 09/25/2021 0000   CREATININE 1.02 08/05/2019 0000   CALCIUM 9.4 08/25/2017 1716   GFRNONAA >60 12/10/2006 1315   GFRAA  12/10/2006 1315    >60        The eGFR has been calculated using the MDRD equation. This calculation has not been validated in all clinical    BNP No results found for: "BNP"  ProBNP No results found for: "PROBNP"  Imaging: DG Knee 4 Views W/Patella Left Result Date: 05/31/2023 CLINICAL DATA:  Knee  pain for 1 month. EXAM: LEFT KNEE - COMPLETE 4+ VIEW COMPARISON:  None Available. FINDINGS: No evidence of acute fracture or dislocation. There are mild-to-moderate tricompartmental degenerative changes, most pronounced in the patellofemoral compartment. A small suprapatellar joint effusion is noted. Soft tissues are unremarkable. IMPRESSION: 1. Mild-to-moderate degenerative changes at the left knee. 2. Small suprapatellar joint effusion. Electronically Signed   By: Wyvonnia Heimlich M.D.   On: 05/31/2023 04:54     Assessment & Plan:   1. OSA (obstructive sleep apnea) (Primary) - Ambulatory Referral for DME - Ambulatory Referral for DME  2. Nocturnal hypoxia - Ambulatory Referral for DME - Ambulatory Referral for DME  Assessment and Plan    Obstructive Sleep Apnea with nocturnal hypoxia  Moderate to severe obstructive sleep apnea. CPAP therapy resumed due to ineffective oral appliance. Titration study set CPAP pressure at 14 with 3L oxygen. CPAP well-tolerated with reduced respiratory events. - Resume CPAP therapy with pressure 14 and 3L oxygen at night. Paediatric nurse F20 ResMed full face mask. - Order oxygen concentrator for nocturnal use. - Contact [employer] to update CPAP settings and register machine. - Follow up in 8-12 weeks for compliance check, possibly virtual.       Antonio Baumgarten, NP 06/11/2023

## 2023-06-12 ENCOUNTER — Other Ambulatory Visit: Payer: Self-pay | Admitting: Family Medicine

## 2023-06-12 NOTE — Telephone Encounter (Signed)
 Copied from CRM [id number]. Topic: Clinical - Medication Refill >> Jun 12, 2023 10:44 AM Lovett Ruck C wrote: Most Recent Primary Care Visit:  Provider: Scherrie Curt  Department: [facility]  Visit Type: ACUTE  Date: 05/21/2023  Medication: zolpidem (AMBIEN) 10 MG tablet  Has the patient contacted their pharmacy? Yes (Agent: If no, request that the patient contact the pharmacy for the refill. If patient does not wish to contact the pharmacy document the reason why and proceed with request.) (Agent: If yes, when and what did the pharmacy advise?)  Is this the correct pharmacy for this prescription? Yes If no, delete pharmacy and type the correct one.  This is the patient's preferred pharmacy:  [facility] [place], [place] - [address] [place] [address] Phone: [phone] Fax: [phone]   Has the prescription been filled recently? No  Is the patient out of the medication? Yes  Has the patient been seen for an appointment in the last year OR does the patient have an upcoming appointment? Yes  Can we respond through MyChart? Yes  Agent: Please be advised that Rx refills may take up to 3 business days. We ask that you follow-up with your pharmacy.

## 2023-06-15 NOTE — Progress Notes (Signed)
     Dawn [name] T. Dawn Packard, MD, CAQ Sports Medicine [facility] at [facility] [address] [place], [address]  Phone: [phone]  FAX: [phone]  Dawn [name] - 63 y.o. female  MRN [id number]  Date of Birth: February 07, 1961  Date: 06/16/2023  PCP: Donnie Galea, MD  Referral: Donnie Galea, MD  No chief complaint on file.  Subjective:   Dawn [name] is a 63 y.o. very pleasant female patient with There is no height or weight on file to calculate BMI. who presents with the following:  She is a pleasant 63 year old patient who presents with follow-up on left-sided knee pain.  I saw her about 1 month ago with some left knee pain and arthritic changes.  At that time, felt like she was having arthritis flare, and at that point she felt like she was doing fairly well with NSAIDs alone.  She does have mild knee arthritis globally.    Review of Systems is noted in the HPI, as appropriate  Objective:   LMP 12/03/2016   GEN: No acute distress; alert,appropriate. PULM: Breathing comfortably in no respiratory distress PSYCH: Normally interactive.   Laboratory and Imaging Data:  Assessment and Plan:   ***

## 2023-06-16 ENCOUNTER — Ambulatory Visit (INDEPENDENT_AMBULATORY_CARE_PROVIDER_SITE_OTHER): Admitting: Family Medicine

## 2023-06-16 ENCOUNTER — Encounter: Payer: Self-pay | Admitting: Family Medicine

## 2023-06-16 VITALS — BP 110/78 | HR 98 | Temp 97.3°F | Ht 67.0 in | Wt 258.1 lb

## 2023-06-16 DIAGNOSIS — M1712 Unilateral primary osteoarthritis, left knee: Secondary | ICD-10-CM

## 2023-06-16 DIAGNOSIS — M1711 Unilateral primary osteoarthritis, right knee: Secondary | ICD-10-CM

## 2023-06-16 DIAGNOSIS — M170 Bilateral primary osteoarthritis of knee: Secondary | ICD-10-CM

## 2023-06-16 MED ORDER — TRIAMCINOLONE ACETONIDE 40 MG/ML IJ SUSP
40.0000 mg | Freq: Once | INTRAMUSCULAR | Status: AC
  Administered 2023-06-16: 40 mg via INTRA_ARTICULAR

## 2023-06-16 MED ORDER — ZOLPIDEM TARTRATE 10 MG PO TABS: 5.0000 mg | ORAL_TABLET | Freq: Every day | ORAL | 2 refills | Status: DC

## 2023-06-16 NOTE — Telephone Encounter (Signed)
 LOV 04/24/23  NOV nothing scheduled  Last refill 12/12/22 # 30 w/ 2 refills

## 2023-06-16 NOTE — Addendum Note (Signed)
 Addended by: Wyn Heater on: 06/16/2023 03:57 PM   Modules accepted: Orders

## 2023-06-16 NOTE — Telephone Encounter (Signed)
 Sent. Thanks.

## 2023-06-24 NOTE — Progress Notes (Signed)
     Dawn [name] T. Kamarie Veno, MD, CAQ Sports Medicine [facility] at [facility] [address] [place], [address]  Phone: [phone]  FAX: [phone]  Dawn [name] - 63 y.o. female  MRN [id number]  Date of Birth: 1960/10/31  Date: 06/26/2023  PCP: Donnie Galea, MD  Referral: Donnie Galea, MD  Chief Complaint  Patient presents with   Knee Pain    Right Knee injection    Patient presents with ongoing right-sided knee osteoarthritis with exacerbation of pain.  I did inject her left knee on June 16, 2023.  At that point she was very anxious and have a lot of concerns about possible systemic steroid involvement post intra-articular injection.  She did okay after her left knee injection she did not really have any significant side effects.    ICD-10-CM   1. Primary osteoarthritis of right knee  M17.11      As I initially was attempting the right sided knee injection through the anteromedial portal, patient seemed to get quite anxious, and she jerked her leg extensively.  I stopped the procedure.  After talking to the patient, we decided that I would brace the leg at 90 degrees to ensure there is no significant motion to increase harm risks to the patient or myself.  On second attempt through the anteromedial portal with the leg braced, there was no significant difficulties.  Aspiration/Injection Procedure Note Dawn [name] 30-Dec-1960 Date of procedure: 06/26/2023  Procedure: Large Joint Aspiration / Injection of Knee, R Indications: Pain  Procedure Details Patient verbally consented to procedure. Risks, benefits, and alternatives explained. Sterilely prepped with Chloraprep. Ethyl cholride used for anesthesia. 9 cc Lidocaine 1% mixed with 1 mL of Kenalog  40 mg injected using the anteromedial approach without difficulty.  Patient had decreased pain post-injection. Medication: 1 mL of Kenalog  40 mg   Medication Management during  today's office visit: No orders of the defined types were placed in this encounter.  Medications Discontinued During This Encounter  Medication Reason   meloxicam  (MOBIC ) 7.5 MG tablet Completed Course    Orders placed today for conditions managed today: No orders of the defined types were placed in this encounter.   Disposition: No follow-ups on file.  [employer] One speech-to-text software was used for transcription in this dictation.  Possible transcriptional errors can occur using Animal nutritionist.   Signed,  Ranny Bye. Ransome Helwig, MD   Outpatient Encounter Medications as of 06/26/2023  Medication Sig   clonazePAM  (KLONOPIN ) 0.5 MG tablet TAKE ONE TABLET TWICE DAILY AS NEEDED FOR ANXIETY   esomeprazole  (NEXIUM ) 20 MG capsule Take 2 capsules (40 mg total) by mouth daily.   fluocinonide  cream (LIDEX ) 0.05 % Apply 1 Application topically 2 (two) times daily as needed.   fluticasone  (FLONASE ) 50 MCG/ACT nasal spray Place 2 sprays into both nostrils daily.   lisinopril -hydrochlorothiazide  (ZESTORETIC ) 20-25 MG tablet TAKE ONE TABLET BY MOUTH DAILY   venlafaxine  XR (EFFEXOR -XR) 150 MG 24 hr capsule TAKE ONE CAPSULE BY MOUTH ONCE DAILY WITH BREAKFAST   zolpidem  (AMBIEN ) 10 MG tablet Take 0.5 tablets (5 mg total) by mouth at bedtime.   [DISCONTINUED] meloxicam  (MOBIC ) 7.5 MG tablet Take 1 tablet (7.5 mg total) by mouth daily. Don't take with ibuprofen or aleve.   No facility-administered encounter medications on file as of 06/26/2023.

## 2023-06-26 ENCOUNTER — Encounter: Payer: Self-pay | Admitting: Family Medicine

## 2023-06-26 ENCOUNTER — Ambulatory Visit: Admitting: Family Medicine

## 2023-06-26 VITALS — BP 130/90 | HR 85 | Temp 97.6°F | Ht 67.0 in | Wt 254.0 lb

## 2023-06-26 DIAGNOSIS — M1711 Unilateral primary osteoarthritis, right knee: Secondary | ICD-10-CM | POA: Diagnosis not present

## 2023-06-26 MED ORDER — TRIAMCINOLONE ACETONIDE 40 MG/ML IJ SUSP
40.0000 mg | Freq: Once | INTRAMUSCULAR | Status: AC
  Administered 2023-06-26: 40 mg via INTRA_ARTICULAR

## 2023-06-26 NOTE — Telephone Encounter (Signed)
 Can you check on changing DME providers for patient as she needs to use [facility]

## 2023-06-27 ENCOUNTER — Encounter: Payer: Self-pay | Admitting: Family Medicine

## 2023-06-30 NOTE — Telephone Encounter (Signed)
 Both order were sent to [facility]. I have now resent them to [facility]

## 2023-07-29 NOTE — Telephone Encounter (Signed)
 I have received a message from Tonya with [facility] [place], [place]   Pulled order for supplies will have processed.

## 2023-07-29 NOTE — Telephone Encounter (Signed)
 I have sent urgent message to [facility] asking them to check on this issue

## 2023-08-11 ENCOUNTER — Other Ambulatory Visit: Payer: Self-pay | Admitting: Family Medicine

## 2023-08-12 ENCOUNTER — Telehealth: Payer: Self-pay

## 2023-08-12 NOTE — Telephone Encounter (Signed)
 Copied from CRM [id number]. Topic: Clinical - Order For Equipment >> Aug 12, 2023 11:19 AM Hilton Lucky wrote: Reason for CRM: Patient is returning call to [facility]. Patient has been having issues with getting supplies and eventually wound up getting it through [employer] instead of a DME company. She has just gotten the supplies and is just starting to use it.   States insurance is not willing to cover supplies because no PA was sent in for it.   States she has been very groggy since using it the last week and half or so and would like to discuss with provider.   I called and spoke to pt. I informed pt that she could discuss any issues with the CPAP with Irby Mannan, NP tomorrow during the appointment. Download will not be printed due to pt buying her own supplies and started usage. NFN

## 2023-08-12 NOTE — Telephone Encounter (Signed)
 Pt is scheduled to see Irby Mannan, NP tomorrow, 08-13-23, for CPAP compliance. Pt does not show much compliance on [employer]. I called [employer] and spoke to [facility] Rodman Clam stated that it does not show much compliance on his computer either and pt is probably not using it. I printed the compliance that it showed. I will call pt and ask her if she is using it.   ATC X1. LVMTCB

## 2023-08-13 ENCOUNTER — Telehealth (INDEPENDENT_AMBULATORY_CARE_PROVIDER_SITE_OTHER): Admitting: Primary Care

## 2023-08-13 ENCOUNTER — Telehealth: Payer: Self-pay | Admitting: Primary Care

## 2023-08-13 DIAGNOSIS — G4733 Obstructive sleep apnea (adult) (pediatric): Secondary | ICD-10-CM

## 2023-08-13 NOTE — Telephone Encounter (Signed)
 ATC patient, no one answered. I left a vm to give us  a call back.

## 2023-08-13 NOTE — Progress Notes (Signed)
 Virtual Visit via Video Note  I connected with Dawn [name] on 08/13/23 at  3:30 PM EDT by a video enabled telemedicine application and verified that I am speaking with the correct person using two identifiers.  Location: Patient: Home Provider: Office   I discussed the limitations of evaluation and management by telemedicine and the availability of in person appointments. The patient expressed understanding and agreed to proceed.  History of Present Illness: 63 year old female, never smoked. PMH significant for HTN, OSA, nocturnal hypoxemia, GERD, OA, CAD, insomnia, lower back pain, anxiety.   Previous [facility] pulmonary encounter 06/11/2023 Discussed the use of AI scribe software for clinical note transcription with the patient, who gave verbal consent to proceed.  History of Present Illness   Dawn [name] is a 63 year old female with moderate to severe sleep apnea who presents for follow-up regarding CPAP therapy.  Initially diagnosed with moderate to severe sleep apnea in 2021, a home sleep study revealed 28 apneic events per hour with oxygen saturation dropping to 67%, averaging 88%. A CPAP titration study in November 2021 set her pressure at 10 cm H2O.  In January 2025, she was referred to dentistry for an oral appliance. Despite using the appliance, she did not notice any improvement in sleep quality or energy levels, though snoring was reduced. A repeat sleep study in October 2024 showed continued oxygen desaturation, leading to a recommendation to resume CPAP therapy.  In March 2025, she underwent a CPAP titration study at [facility], where she was fitted with an AirFit F20 ResMed full face mask. The CPAP pressure was titrated from 6 to 14 cm H2O with heated humidity, and she was placed on 2 liters of oxygen. She tolerated the CPAP well, with limited respiratory events and snoring. She took Ambien  at bedtime during the study.  She reports that her husband, who now has  his own CPAP machine, was previously using her machine. She is currently using [employer] as her medical supply company for her CPAP needs.  1. OSA (obstructive sleep apnea) (Primary) - Ambulatory Referral for DME - Ambulatory Referral for DME   2. Nocturnal hypoxia - Ambulatory Referral for DME - Ambulatory Referral for DME   Assessment and Plan    Obstructive Sleep Apnea with nocturnal hypoxia  Moderate to severe obstructive sleep apnea. CPAP therapy resumed due to ineffective oral appliance. Titration study set CPAP pressure at 14 with 3L oxygen. CPAP well-tolerated with reduced respiratory events. - Resume CPAP therapy with pressure 14 and 3L oxygen at night. Paediatric nurse F20 ResMed full face mask. - Order oxygen concentrator for nocturnal use. - Contact [employer] to update CPAP settings and register machine. - Follow up in 8-12 weeks for compliance check, possibly virtual.          08/13/2023- Interim hx Discussed the use of AI scribe software for clinical note transcription with the patient, who gave verbal consent to proceed.  History of Present Illness   Dawn [name] is a 63 year old female with moderate obstructive sleep apnea who presents for a follow-up visit regarding her CPAP therapy.  She was initially diagnosed with moderate obstructive sleep apnea in 2021, with 28 apneic events per hour. A titration study in 2021 indicated a pressure of 10 cm H2O. In January 2025, she tried an oral appliance from her dentist, which reduced snoring but did not improve her energy levels. A repeat sleep study in October 2024 showed continued oxygen desaturations.  In March  2025, she underwent a CPAP titration study at [facility], which adjusted her pressure from 6 to 14 cm H2O and added 2 liters of oxygen. She uses the CPAP with a pressure setting of 14 cm H2O and an F30i mask with an oxygen adapter. She ordered her own supplies from [place] after a delay in receiving them from her  supplier due to insurance issues.  She feels groggy upon waking, stating 'for a good hour, I don't really feel like it's safe for me to drive.' She notes increased daytime sleepiness when using the CPAP compared to not using it. She takes Ambien , half a tablet, which provides about four hours of sleep, but she wakes up frequently after that. She has tried a full tablet on particularly stressful days.  She mentions not remembering dreams when using the CPAP but experiencing them when she removes the mask early in the morning. She has a family history of heart issues and is on blood pressure medication. No personal history of myocardial infarction, stroke, or arrhythmia.      08/01/2023 - 08/14/2023 Usage days 8/10 days (80%) greater than 4 hours Average usage 5 hours 30 minutes Pressure 14 cm H2O Air leaks 10 L/min (95%) AHI 2.7   Observations/Objective:   Assessment and Plan:  Assessment and Plan    Obstructive Sleep Apnea Moderate obstructive sleep apnea diagnosed in 2021 with 28 apneic events per hour. CPAP titration study in March 2025 set pressure at 14 cm H2O with 2 liters of oxygen. Current settings show 2.7 apneic events per hour, indicating well-controlled but occasional apnea. Reports grogginess upon waking and excessive daytime sleepiness despite CPAP use. Consideration of increasing CPAP pressure to address residual apneic events. Discussed potential use of modafinil for excessive daytime sleepiness if pressure adjustment is insufficient. - Increase CPAP pressure to 16 cm H2O. - Ensure CPAP machine is registered and settings updated remotely. - Order CPAP supplies, including the F30i mask, through the home health supplier. - Advise to continue using CPAP with 3 liters of oxygen at night. - Consider modafinil if excessive daytime sleepiness persists after CPAP pressure adjustment. - Monitor blood pressure and heart rate if modafinil is initiated.  Insomnia Currently takes 5 mg  of Ambien , providing approximately 4 hours of sleep. Discussed increasing to 10 mg to improve sleep duration. Experiences waking in the middle of the night, potentially due to apneic events. Advised to ensure 7-9 hours of sleep when taking Ambien . - Increase Ambien  to 10 mg to improve sleep duration. - Advise to ensure 7-9 hours of sleep when taking Ambien . - If daytime grogginess occurs, reduce Ambien  back to 5 mg.  Hypertension Hypertension managed with medication. Family history of heart issues noted. Discussed potential impact of modafinil on blood pressure and heart rate. No personal history of myocardial infarction, stroke, or arrhythmia. - Monitor blood pressure and heart rate if modafinil is initiated.   Follow Up Instructions:  6 weeks    I discussed the assessment and treatment plan with the patient. The patient was provided an opportunity to ask questions and all were answered. The patient agreed with the plan and demonstrated an understanding of the instructions.   The patient was advised to call back or seek an in-person evaluation if the symptoms worsen or if the condition fails to improve as anticipated.  I provided 22 minutes of non-face-to-face time during this encounter.   Antonio Baumgarten, NP

## 2023-08-13 NOTE — Telephone Encounter (Signed)
 Patient needs 6 week virtual visit with Beth NP for CPAP compliance

## 2023-08-18 ENCOUNTER — Encounter: Payer: Self-pay | Admitting: Adult Health

## 2023-08-18 NOTE — Telephone Encounter (Signed)
 Sent postal letter too.

## 2023-08-18 NOTE — Telephone Encounter (Signed)
 ATC patient x2, I left a vm and will send MyChart message as well.

## 2023-08-18 NOTE — Telephone Encounter (Signed)
 Various attempts. Closing out.

## 2023-08-19 ENCOUNTER — Encounter: Payer: Self-pay | Admitting: Family Medicine

## 2023-08-21 ENCOUNTER — Other Ambulatory Visit: Payer: Self-pay | Admitting: Family Medicine

## 2023-08-21 DIAGNOSIS — Z872 Personal history of diseases of the skin and subcutaneous tissue: Secondary | ICD-10-CM

## 2023-09-02 ENCOUNTER — Encounter: Payer: Self-pay | Admitting: Family Medicine

## 2023-09-02 MED ORDER — ZOLPIDEM TARTRATE 10 MG PO TABS: 10.0000 mg | ORAL_TABLET | Freq: Every day | ORAL | 2 refills | Status: DC

## 2023-09-13 ENCOUNTER — Other Ambulatory Visit: Payer: Self-pay | Admitting: Family Medicine

## 2023-09-26 ENCOUNTER — Ambulatory Visit (INDEPENDENT_AMBULATORY_CARE_PROVIDER_SITE_OTHER): Admitting: Family Medicine

## 2023-09-26 VITALS — BP 128/84 | HR 83 | Temp 98.3°F | Ht 66.5 in | Wt 250.1 lb

## 2023-09-26 DIAGNOSIS — I10 Essential (primary) hypertension: Secondary | ICD-10-CM

## 2023-09-26 DIAGNOSIS — Z7189 Other specified counseling: Secondary | ICD-10-CM

## 2023-09-26 DIAGNOSIS — F419 Anxiety disorder, unspecified: Secondary | ICD-10-CM

## 2023-09-26 DIAGNOSIS — G4733 Obstructive sleep apnea (adult) (pediatric): Secondary | ICD-10-CM

## 2023-09-26 DIAGNOSIS — Z0000 Encounter for general adult medical examination without abnormal findings: Secondary | ICD-10-CM

## 2023-09-26 DIAGNOSIS — G4700 Insomnia, unspecified: Secondary | ICD-10-CM

## 2023-09-26 LAB — CBC WITH DIFFERENTIAL/PLATELET
Basophils Absolute: 0 K/uL (ref 0.0–0.1)
Basophils Relative: 0.5 % (ref 0.0–3.0)
Eosinophils Absolute: 0.1 K/uL (ref 0.0–0.7)
Eosinophils Relative: 2.6 % (ref 0.0–5.0)
HCT: 41.1 % (ref 36.0–46.0)
Hemoglobin: 13.8 g/dL (ref 12.0–15.0)
Lymphocytes Relative: 28.3 % (ref 12.0–46.0)
Lymphs Abs: 1.6 K/uL (ref 0.7–4.0)
MCHC: 33.5 g/dL (ref 30.0–36.0)
MCV: 95.1 fl (ref 78.0–100.0)
Monocytes Absolute: 0.4 K/uL (ref 0.1–1.0)
Monocytes Relative: 7.5 % (ref 3.0–12.0)
Neutro Abs: 3.4 K/uL (ref 1.4–7.7)
Neutrophils Relative %: 61.1 % (ref 43.0–77.0)
Platelets: 214 K/uL (ref 150.0–400.0)
RBC: 4.33 Mil/uL (ref 3.87–5.11)
RDW: 14.5 % (ref 11.5–15.5)
WBC: 5.6 K/uL (ref 4.0–10.5)

## 2023-09-26 LAB — LIPID PANEL
Cholesterol: 209 mg/dL — ABNORMAL HIGH (ref 0–200)
HDL: 61.5 mg/dL (ref >39.00)
LDL Cholesterol: 126 mg/dL — ABNORMAL HIGH (ref 0–99)
NonHDL: 147.35
Total CHOL/HDL Ratio: 3
Triglycerides: 106 mg/dL (ref 0.0–149.0)
VLDL: 21.2 mg/dL (ref 0.0–40.0)

## 2023-09-26 LAB — COMPREHENSIVE METABOLIC PANEL WITH GFR
ALT: 22 U/L (ref 0–35)
AST: 18 U/L (ref 0–37)
Albumin: 4.5 g/dL (ref 3.5–5.2)
Alkaline Phosphatase: 74 U/L (ref 39–117)
BUN: 17 mg/dL (ref 6–23)
CO2: 33 meq/L — ABNORMAL HIGH (ref 19–32)
Calcium: 9.3 mg/dL (ref 8.4–10.5)
Chloride: 102 meq/L (ref 96–112)
Creatinine, Ser: 0.89 mg/dL (ref 0.40–1.20)
GFR: 69.14 mL/min (ref >60.00)
Glucose, Bld: 99 mg/dL (ref 70–99)
Potassium: 3.5 meq/L (ref 3.5–5.1)
Sodium: 144 meq/L (ref 135–145)
Total Bilirubin: 0.4 mg/dL (ref 0.2–1.2)
Total Protein: 6.7 g/dL (ref 6.0–8.3)

## 2023-09-26 LAB — TSH: TSH: 3.56 u[IU]/mL (ref 0.35–5.50)

## 2023-09-26 MED ORDER — CLONAZEPAM 0.5 MG PO TABS: ORAL_TABLET | ORAL | 2 refills | Status: AC

## 2023-09-26 MED ORDER — LISINOPRIL-HYDROCHLOROTHIAZIDE 20-25 MG PO TABS: 1.0000 | ORAL_TABLET | Freq: Every day | ORAL | 3 refills | Status: AC

## 2023-09-26 MED ORDER — ESTROGENS CONJUGATED 0.625 MG/GM VA CREA: 1.0000 | TOPICAL_CREAM | Freq: Every day | VAGINAL | Status: AC | PRN

## 2023-09-26 MED ORDER — VENLAFAXINE HCL ER 150 MG PO CP24: ORAL_CAPSULE | ORAL | 3 refills | Status: AC

## 2023-09-26 NOTE — Progress Notes (Signed)
 CPE- See plan.  Routine anticipatory guidance given to patient.  See health maintenance.  The possibility exists that previously documented standard health maintenance information may have been brought forward from a previous encounter into this note.  If needed, that same information has been updated to reflect the current situation based on today's encounter.    Tetanus 2021. Flu due this fall.   PNA d/w pt.   Shingles 2023 Covid vaccine prev done.   Colonoscopy 2024.   Mammogram can be done fall 2025.   [facility].  Pap per gyn.   Living will d/w pt.  Husband designated if patient were incapacitated.    Anxiety.  Still on effexor  and klonopin .  No ADE on med.  D/w pt about upheaval with her mother's passing.  Using klonopin  prn.  It helps.  No SI/HI.     Hypertension:               Using medication without problems or lightheadedness: yes Chest pain with exertion:no Edema:no Short of breath:no See notes on labs   Taking ambien  at night, with relief. No ADE on med.  Taking 1/2 to 1 tab at night.    OSA on CPAP.  She is going to check on options re: pressure settings.    She was using estrogen cream per outside clinic.  D/w pt.  I asked her to check the dose and update me as needed.    PMH and SH reviewed  Meds, vitals, and allergies reviewed.   ROS: Per HPI.  Unless specifically indicated otherwise in HPI, the patient denies:  General: fever. Eyes: acute vision changes ENT: sore throat Cardiovascular: chest pain Respiratory: SOB GI: vomiting GU: dysuria Musculoskeletal: acute back pain Derm: acute rash Neuro: acute motor dysfunction Psych: worsening mood Endocrine: polydipsia Heme: bleeding Allergy: hayfever  GEN: nad, alert and oriented HEENT: ncat NECK: supple w/o LA CV: rrr. PULM: ctab, no inc wob ABD: soft, +bs EXT: no edema SKIN: no acute rash

## 2023-09-26 NOTE — Patient Instructions (Addendum)
 Please check the dose on the estrogen cream and update me as needed.   Please check with pulmonary and call about a mammogram.  Take care.  Glad to see you. Go to the lab on the way out.   If you have mychart we'll likely use that to update you.

## 2023-09-28 ENCOUNTER — Ambulatory Visit: Payer: Self-pay | Admitting: Family Medicine

## 2023-09-28 NOTE — Assessment & Plan Note (Signed)
 I will route a copy of this note to Almarie Ferrari.  My question is if the patient needs pressure adjustment to make it easier for her to tolerate the mask at night.  I appreciate the help of all involved.

## 2023-09-28 NOTE — Assessment & Plan Note (Signed)
 Taking ambien  at night, with relief. No ADE on med.  Taking 1/2 to 1 tab at night.  Continue as is.

## 2023-09-28 NOTE — Assessment & Plan Note (Signed)
Living will d/w pt.  Husband designated if patient were incapacitated.  

## 2023-09-28 NOTE — Assessment & Plan Note (Signed)
 Still on effexor  and klonopin .  No ADE on med.  D/w pt about upheaval with her mother's passing.  Using klonopin  prn.  It helps.  No SI/HI.   Would continue as is.

## 2023-09-28 NOTE — Assessment & Plan Note (Signed)
 Tetanus 2021. Flu due this fall.   PNA d/w pt.   Shingles 2023 Covid vaccine prev done.   Colonoscopy 2024.   Mammogram can be done fall 2025.   DXA Done 2019 at [facility].  Pap per gyn.   Living will d/w pt.  Husband designated if patient were incapacitated.

## 2023-09-28 NOTE — Assessment & Plan Note (Signed)
 Continue work on diet and exercise.  Continue lisinopril  hydrochlorothiazide .

## 2023-10-30 ENCOUNTER — Encounter: Payer: Self-pay | Admitting: Primary Care

## 2023-10-30 ENCOUNTER — Ambulatory Visit (INDEPENDENT_AMBULATORY_CARE_PROVIDER_SITE_OTHER): Admitting: Primary Care

## 2023-10-30 VITALS — BP 136/72 | HR 90 | Temp 97.4°F | Ht 67.0 in | Wt 249.0 lb

## 2023-10-30 DIAGNOSIS — G4734 Idiopathic sleep related nonobstructive alveolar hypoventilation: Secondary | ICD-10-CM

## 2023-10-30 DIAGNOSIS — G4733 Obstructive sleep apnea (adult) (pediatric): Secondary | ICD-10-CM

## 2023-10-30 NOTE — Patient Instructions (Signed)
  VISIT SUMMARY: Today, we discussed your concerns about the noise level of your oxygen concentrator and reviewed your current treatment for sleep apnea. We also talked about your use of CPAP, Ambien , and your efforts to lose weight through lifestyle changes.  YOUR PLAN: -OBSTRUCTIVE SLEEP APNEA WITH NOCTURNAL HYPOXEMIA: Obstructive sleep apnea is a condition where your airway becomes blocked during sleep, causing breathing pauses and low oxygen levels. You need to use a CPAP machine and supplemental oxygen at night. We discussed the importance of using your CPAP consistently for 4-6 hours each night. You should talk to your medical supply store about quieter oxygen concentrator options. We also discussed weight loss strategies, including lifestyle changes and possibly using a medication called Zepbound in the future if insurance coverage changes. Continue taking Ambien  10 mg to help with sleep. To reduce morning congestion, try turning off or lowering the CPAP humidification and use Flonase  before bed instead of in the morning. You may also consider taking antihistamines like Allegra or Xyzal for additional allergy relief.  INSTRUCTIONS: Please follow up with your medical supply store to discuss quieter oxygen concentrator options. Continue using your CPAP machine consistently for 4-6 hours each night. If you experience any issues or have further questions, please schedule a follow-up appointment.  Follow-up 6 months with Landry NP

## 2023-10-30 NOTE — Progress Notes (Signed)
 @Patient  ID: Dawn [name], female    DOB: 04/09/1960, 63 y.o.   MRN: [id number]  No chief complaint on file.   Referring provider: Cleatus Arlyss RAMAN, MD  HPI: 63 year old female, never smoked. PMH significant for HTN, OSA, nocturnal hypoxemia, GERD, OA, CAD, insomnia, lower back pain, anxiety.   Previous [facility] pulmonary encounter 06/11/2023 Discussed the use of AI scribe software for clinical note transcription with the patient, who gave verbal consent to proceed.  History of Present Illness   Dawn [name] is a 63 year old female with moderate to severe sleep apnea who presents for follow-up regarding CPAP therapy.  Initially diagnosed with moderate to severe sleep apnea in 2021, a home sleep study revealed 28 apneic events per hour with oxygen saturation dropping to 67%, averaging 88%. A CPAP titration study in November 2021 set her pressure at 10 cm H2O.  In January 2025, she was referred to dentistry for an oral appliance. Despite using the appliance, she did not notice any improvement in sleep quality or energy levels, though snoring was reduced. A repeat sleep study in October 2024 showed continued oxygen desaturation, leading to a recommendation to resume CPAP therapy.  In March 2025, she underwent a CPAP titration study at [facility], where she was fitted with an AirFit F20 ResMed full face mask. The CPAP pressure was titrated from 6 to 14 cm H2O with heated humidity, and she was placed on 2 liters of oxygen. She tolerated the CPAP well, with limited respiratory events and snoring. She took Ambien  at bedtime during the study.  She reports that her husband, who now has his own CPAP machine, was previously using her machine. She is currently using [employer] as her medical supply company for her CPAP needs.  1. OSA (obstructive sleep apnea) (Primary) - Ambulatory Referral for DME   2. Nocturnal hypoxia - Ambulatory Referral for DME   Assessment and Plan     Obstructive Sleep Apnea with nocturnal hypoxia  Moderate to severe obstructive sleep apnea. CPAP therapy resumed due to ineffective oral appliance. Titration study set CPAP pressure at 14 with 3L oxygen. CPAP well-tolerated with reduced respiratory events. - Resume CPAP therapy with pressure 14 and 3L oxygen at night. Paediatric nurse F20 ResMed full face mask. - Order oxygen concentrator for nocturnal use. - Contact [employer] to update CPAP settings and register machine. - Follow up in 8-12 weeks for compliance check, possibly virtual.       08/13/2023 Discussed the use of AI scribe software for clinical note transcription with the patient, who gave verbal consent to proceed.  History of Present Illness   Dawn [name] is a 63 year old female with moderate obstructive sleep apnea who presents for a follow-up visit regarding her CPAP therapy.  She was initially diagnosed with moderate obstructive sleep apnea in 2021, with 28 apneic events per hour. A titration study in 2021 indicated a pressure of 10 cm H2O. In January 2025, she tried an oral appliance from her dentist, which reduced snoring but did not improve her energy levels. A repeat sleep study in October 2024 showed continued oxygen desaturations.  In March 2025, she underwent a CPAP titration study at [facility], which adjusted her pressure from 6 to 14 cm H2O and added 2 liters of oxygen. She uses the CPAP with a pressure setting of 14 cm H2O and an F30i mask with an oxygen adapter. She ordered her own supplies from [place] after a delay in  receiving them from her supplier due to insurance issues.  She feels groggy upon waking, stating 'for a good hour, I don't really feel like it's safe for me to drive.' She notes increased daytime sleepiness when using the CPAP compared to not using it. She takes Ambien , half a tablet, which provides about four hours of sleep, but she wakes up frequently after that. She has tried a full tablet on  particularly stressful days.  She mentions not remembering dreams when using the CPAP but experiencing them when she removes the mask early in the morning. She has a family history of heart issues and is on blood pressure medication. No personal history of myocardial infarction, stroke, or arrhythmia.      08/01/2023 - 08/14/2023 Usage days 8/10 days (80%) greater than 4 hours Average usage 5 hours 30 minutes Pressure 14 cm H2O Air leaks 10 L/min (95%) AHI 2.7    Assessment and Plan    Obstructive Sleep Apnea Moderate obstructive sleep apnea diagnosed in 2021 with 28 apneic events per hour. CPAP titration study in March 2025 set pressure at 14 cm H2O with 2 liters of oxygen. Current settings show 2.7 apneic events per hour, indicating well-controlled but occasional apnea. Reports grogginess upon waking and excessive daytime sleepiness despite CPAP use. Consideration of increasing CPAP pressure to address residual apneic events. Discussed potential use of modafinil for excessive daytime sleepiness if pressure adjustment is insufficient. - Increase CPAP pressure to 16 cm H2O. - Ensure CPAP machine is registered and settings updated remotely. - Order CPAP supplies, including the F30i mask, through the home health supplier. - Advise to continue using CPAP with 3 liters of oxygen at night. - Consider modafinil if excessive daytime sleepiness persists after CPAP pressure adjustment. - Monitor blood pressure and heart rate if modafinil is initiated.  Insomnia Currently takes 5 mg of Ambien , providing approximately 4 hours of sleep. Discussed increasing to 10 mg to improve sleep duration. Experiences waking in the middle of the night, potentially due to apneic events. Advised to ensure 7-9 hours of sleep when taking Ambien . - Increase Ambien  to 10 mg to improve sleep duration. - Advise to ensure 7-9 hours of sleep when taking Ambien . - If daytime grogginess occurs, reduce Ambien  back to 5  mg.  Hypertension Hypertension managed with medication. Family history of heart issues noted. Discussed potential impact of modafinil on blood pressure and heart rate. No personal history of myocardial infarction, stroke, or arrhythmia. - Monitor blood pressure and heart rate if modafinil is initiated.     10/30/2023- Interim hx  Discussed the use of AI scribe software for clinical note transcription with the patient, who gave verbal consent to proceed.  History of Present Illness Dawn [name] is a 63 year old female with sleep apnea who presents with concerns about her oxygen concentrator's noise level.  She uses an oxygen concentrator at night due to sleep apnea, diagnosed following an in-lab sleep study in February. The device is loud. She uses it with a 50-foot cord to minimize noise but finds it inconvenient due to household pets. She uses the concentrator with 2-3 liters of oxygen per night.  She has been using a CPAP machine consistently for about six hours each night, although she sometimes skips weekends when her granddaughters stay over. She has started using a mouthpiece simultaneously to reduce air leaks. She experiences congestion upon waking, which she attributes to the CPAP humidification settings.  She is taking Ambien  10 mg, which she  increased from half a tablet to a full tablet, to help with sleep disturbances. She finds this dosage effective in improving her sleep duration without residual daytime grogginess.  She has been attempting lifestyle changes, including reducing alcohol consumption and increasing physical activity by attending the [facility], to aid in weight loss, which she hopes will eventually reduce her need for oxygen therapy.  Airview download 08/01/23-10/28/23 Usage days 56/89 days Average usage days used 6 hours 24 mins Pressure 16cm h20 Airleaks 10.1L (95%) AHI 2.1   Allergies  Allergen Reactions   Augmentin [Amoxicillin-Pot Clavulanate]     Severe  yeast infection   Levonorgestrel-Eth Estradiol [Levonorgestrel-Ethinyl Estrad] Other (See Comments)    Other reaction(s): eye redness, headache, lightheadedness    Immunization History  Administered Date(s) Administered   Influenza Inj Mdck Quad With Preservative 12/04/2016, 12/02/2019   Influenza,inj,Quad PF,6+ Mos 11/27/2017, 12/01/2019, 12/11/2020, 11/26/2021   Influenza,inj,quad, With Preservative 11/20/2013   Influenza-Unspecified 11/04/2022   MMR 02/25/1962   PFIZER(Purple Top)SARS-COV-2 Vaccination 05/10/2019, 05/31/2019, 01/28/2020   Tdap 10/07/2019   Varicella 02/25/1962   Zoster Recombinant(Shingrix) 03/27/2021, 05/26/2021    Past Medical History:  Diagnosis Date   Allergy    Anemia    Anxiety    Arthritis    GERD (gastroesophageal reflux disease)    H/O hiatal hernia    HTN (hypertension)    Insomnia    OSA (obstructive sleep apnea)    Sleep apnea    Urinary incontinence    Uterine polyp    s/p resection    Tobacco History: Social History   Tobacco Use  Smoking Status Never  Smokeless Tobacco Never   Counseling given: Not Answered   Outpatient Medications Prior to Visit  Medication Sig Dispense Refill   clonazePAM  (KLONOPIN ) 0.5 MG tablet TAKE ONE TABLET TWICE DAILY AS NEEDED FOR ANXIETY 10 tablet 2   conjugated estrogens  (PREMARIN ) vaginal cream Place 1 Applicatorful vaginally daily as needed.     esomeprazole  (NEXIUM ) 20 MG capsule Take 2 capsules (40 mg total) by mouth daily.     fluticasone  (FLONASE ) 50 MCG/ACT nasal spray Place 2 sprays into both nostrils daily. 16 g 6   lisinopril -hydrochlorothiazide  (ZESTORETIC ) 20-25 MG tablet Take 1 tablet by mouth daily. 90 tablet 3   venlafaxine  XR (EFFEXOR -XR) 150 MG 24 hr capsule TAKE ONE CAPSULE BY MOUTH ONCE DAILY WITH BREAKFAST 90 capsule 3   zolpidem  (AMBIEN ) 10 MG tablet Take 1 tablet (10 mg total) by mouth at bedtime. 30 tablet 2   No facility-administered medications prior to visit.   Physical  Exam  LMP 12/03/2016  Physical Exam Constitutional:      Appearance: Normal appearance. She is well-developed.  HENT:     Head: Normocephalic and atraumatic.     Mouth/Throat:     Mouth: Mucous membranes are moist.     Pharynx: Oropharynx is clear.  Eyes:     Pupils: Pupils are equal, round, and reactive to light.  Cardiovascular:     Rate and Rhythm: Normal rate and regular rhythm.     Heart sounds: Normal heart sounds. No murmur heard. Pulmonary:     Effort: Pulmonary effort is normal. No respiratory distress.     Breath sounds: Normal breath sounds. No wheezing or rhonchi.  Abdominal:     General: Bowel sounds are normal.     Palpations: Abdomen is soft.     Tenderness: There is no abdominal tenderness.  Musculoskeletal:        General: Normal range of motion.  Cervical back: Normal range of motion and neck supple.  Skin:    General: Skin is warm and dry.     Findings: No erythema or rash.  Neurological:     General: No focal deficit present.     Mental Status: She is alert and oriented to person, place, and time. Mental status is at baseline.  Psychiatric:        Mood and Affect: Mood normal.        Behavior: Behavior normal.        Thought Content: Thought content normal.        Judgment: Judgment normal.      Lab Results:  CBC    Component Value Date/Time   WBC 5.6 09/26/2023 0846   RBC 4.33 09/26/2023 0846   HGB 13.8 09/26/2023 0846   HGB 15.2 08/05/2019 0000   HCT 41.1 09/26/2023 0846   HCT 33 03/05/2017 0000   PLT 214.0 09/26/2023 0846   PLT 246 08/05/2019 0000   MCV 95.1 09/26/2023 0846   MCV 71 03/05/2017 0000   MCHC 33.5 09/26/2023 0846   RDW 14.5 09/26/2023 0846   LYMPHSABS 1.6 09/26/2023 0846   MONOABS 0.4 09/26/2023 0846   EOSABS 0.1 09/26/2023 0846   BASOSABS 0.0 09/26/2023 0846    BMET    Component Value Date/Time   NA 144 09/26/2023 0846   K 3.5 09/26/2023 0846   K 3.8 03/05/2017 0000   CL 102 09/26/2023 0846   CO2 33 (H)  09/26/2023 0846   GLUCOSE 99 09/26/2023 0846   BUN 17 09/26/2023 0846   BUN 14 06/19/2016 0000   CREATININE 0.89 09/26/2023 0846   CREATININE 1.02 08/05/2019 0000   CALCIUM 9.3 09/26/2023 0846   GFRNONAA >60 12/10/2006 1315   GFRAA  12/10/2006 1315    >60        The eGFR has been calculated using the MDRD equation. This calculation has not been validated in all clinical    BNP No results found for: BNP  ProBNP No results found for: PROBNP  Imaging: No results found.   Assessment & Plan:    1. OSA (obstructive sleep apnea) (Primary)  2. Nocturnal hypoxemia   Assessment and Plan Assessment & Plan Obstructive sleep apnea with nocturnal hypoxemia  Obstructive sleep apnea diagnosed via in-lab sleep study in February, requiring 2-3 liters of supplemental oxygen. She reports using CPAP for about six hours nightly but not consistently every night. Reports loud noise from oxygen concentrator affecting sleep quality. Discussed potential weight loss as a means to reduce or eliminate the need for supplemental oxygen. Considered GLP-1 receptor agonist, Zepbound, for weight loss and sleep apnea management, but insurance does not cover it. Discussed potential side effects of Zepbound, including nausea, vomiting, diarrhea, and constipation. She is aware of the need for consistent CPAP use and is considering lifestyle changes to aid weight loss. - Encourage consistent nightly use of CPAP for 4-6 hours. - Discuss with medical supply store about quieter oxygen concentrator options. - Consider weight loss strategies, including lifestyle changes and potential future use of GLP-1 receptor agonist if insurance coverage changes. - Continue Ambien  10 mg for sleep improvement. - Advise trial of turning off or lowering CPAP humidification to reduce morning congestion. - Suggest using Flonase  before bed instead of in the morning. - Consider antihistamines like Allegra or Xyzal for additional  allergy relief.  Almarie LELON Ferrari, NP 10/30/2023

## 2023-12-19 ENCOUNTER — Other Ambulatory Visit: Payer: Self-pay | Admitting: Family Medicine

## 2023-12-19 DIAGNOSIS — G4700 Insomnia, unspecified: Secondary | ICD-10-CM

## 2023-12-19 NOTE — Telephone Encounter (Signed)
 Name of Medication:  Ambien  Name of Pharmacy:  [facility] Last Fill or Written Date and Quantity:  11/14/23, #30 Last Office Visit and Type:  09/26/23, CPE Next Office Visit and Type:  none Last Controlled Substance Agreement Date:  none Last UDS:  none

## 2023-12-29 ENCOUNTER — Encounter: Payer: Self-pay | Admitting: Radiology

## 2024-01-07 NOTE — Telephone Encounter (Signed)
 Thanks

## 2024-01-13 ENCOUNTER — Telehealth: Payer: Self-pay | Admitting: Family Medicine

## 2024-01-13 DIAGNOSIS — G4733 Obstructive sleep apnea (adult) (pediatric): Secondary | ICD-10-CM

## 2024-01-13 NOTE — Telephone Encounter (Signed)
I put in the referral.  Thanks.  

## 2024-01-13 NOTE — Telephone Encounter (Signed)
 I got a message that patient wanted to see Dr. Shlomo re: CPAP/OSA.  Please check with patient and I can put in the referral.  Thanks.

## 2024-01-13 NOTE — Telephone Encounter (Signed)
 Called Patient and she confirmed she wants a referral to see Dr. Shlomo for CPAP/OSA.

## 2024-02-27 ENCOUNTER — Encounter

## 2024-03-18 ENCOUNTER — Other Ambulatory Visit: Payer: Self-pay | Admitting: Family Medicine

## 2024-03-18 ENCOUNTER — Encounter: Payer: Self-pay | Admitting: Family Medicine

## 2024-03-18 DIAGNOSIS — G4700 Insomnia, unspecified: Secondary | ICD-10-CM

## 2024-03-18 NOTE — Telephone Encounter (Signed)
 See 03/18/24 refill note.

## 2024-03-18 NOTE — Telephone Encounter (Signed)
 Name of Medication:  Ambien  Name of Pharmacy:  [facility] Last Fill or Written Date and Quantity:  02/17/24, #30 Last Office Visit and Type:  09/26/23, CPE Next Office Visit and Type:  none Last Controlled Substance Agreement Date:  none Last UDS:  none
